# Patient Record
Sex: Male | Born: 1945 | Race: White | Hispanic: No | Smoking: Former smoker
Health system: Southern US, Community
[De-identification: ages and names within clinical notes are randomized; demographics above are authoritative.]

## PROBLEM LIST (undated history)

## (undated) DIAGNOSIS — M792 Neuralgia and neuritis, unspecified: Secondary | ICD-10-CM

## (undated) DIAGNOSIS — K219 Gastro-esophageal reflux disease without esophagitis: Secondary | ICD-10-CM

## (undated) DIAGNOSIS — G540 Brachial plexus disorders: Secondary | ICD-10-CM

## (undated) DIAGNOSIS — E782 Mixed hyperlipidemia: Secondary | ICD-10-CM

## (undated) DIAGNOSIS — M62838 Other muscle spasm: Secondary | ICD-10-CM

## (undated) DIAGNOSIS — F1910 Other psychoactive substance abuse, uncomplicated: Secondary | ICD-10-CM

## (undated) DIAGNOSIS — N200 Calculus of kidney: Secondary | ICD-10-CM

## (undated) DIAGNOSIS — M1990 Unspecified osteoarthritis, unspecified site: Secondary | ICD-10-CM

## (undated) DIAGNOSIS — M7710 Lateral epicondylitis, unspecified elbow: Secondary | ICD-10-CM

## (undated) DIAGNOSIS — M7918 Myalgia, other site: Secondary | ICD-10-CM

## (undated) DIAGNOSIS — R252 Cramp and spasm: Secondary | ICD-10-CM

## (undated) DIAGNOSIS — M5410 Radiculopathy, site unspecified: Secondary | ICD-10-CM

## (undated) DIAGNOSIS — M542 Cervicalgia: Secondary | ICD-10-CM

## (undated) DIAGNOSIS — M79646 Pain in unspecified finger(s): Secondary | ICD-10-CM

## (undated) DIAGNOSIS — I10 Essential (primary) hypertension: Secondary | ICD-10-CM

## (undated) HISTORY — DX: Radiculopathy, site unspecified: M54.10

## (undated) HISTORY — DX: Cervicalgia: M54.2

## (undated) HISTORY — DX: Other muscle spasm: M62.838

## (undated) HISTORY — DX: Cramp and spasm: R25.2

## (undated) HISTORY — DX: Lateral epicondylitis, unspecified elbow: M77.10

## (undated) HISTORY — DX: Myalgia, other site: M79.18

## (undated) HISTORY — PX: SPINE SURGERY: SHX786

## (undated) HISTORY — DX: Other psychoactive substance abuse, uncomplicated: F19.10

## (undated) HISTORY — DX: Neuralgia and neuritis, unspecified: M79.2

## (undated) HISTORY — DX: Mixed hyperlipidemia: E78.2

## (undated) HISTORY — DX: Calculus of kidney: N20.0

## (undated) HISTORY — DX: Gastro-esophageal reflux disease without esophagitis: K21.9

## (undated) HISTORY — DX: Unspecified osteoarthritis, unspecified site: M19.90

## (undated) HISTORY — DX: Pain in unspecified finger(s): M79.646

## (undated) HISTORY — DX: Brachial plexus disorders: G54.0

## (undated) HISTORY — DX: Essential (primary) hypertension: I10

---

## 2007-08-14 ENCOUNTER — Emergency Department: Payer: Self-pay | Admitting: Emergency Medicine

## 2012-02-28 DIAGNOSIS — G540 Brachial plexus disorders: Secondary | ICD-10-CM | POA: Insufficient documentation

## 2012-02-28 DIAGNOSIS — N139 Obstructive and reflux uropathy, unspecified: Secondary | ICD-10-CM | POA: Insufficient documentation

## 2012-02-28 DIAGNOSIS — Z1211 Encounter for screening for malignant neoplasm of colon: Secondary | ICD-10-CM | POA: Insufficient documentation

## 2012-02-28 DIAGNOSIS — E291 Testicular hypofunction: Secondary | ICD-10-CM | POA: Insufficient documentation

## 2012-02-28 DIAGNOSIS — I10 Essential (primary) hypertension: Secondary | ICD-10-CM

## 2012-02-28 DIAGNOSIS — G5600 Carpal tunnel syndrome, unspecified upper limb: Secondary | ICD-10-CM | POA: Insufficient documentation

## 2012-02-28 HISTORY — DX: Essential (primary) hypertension: I10

## 2012-02-28 HISTORY — DX: Brachial plexus disorders: G54.0

## 2013-06-18 DIAGNOSIS — M542 Cervicalgia: Secondary | ICD-10-CM

## 2013-06-18 DIAGNOSIS — G479 Sleep disorder, unspecified: Secondary | ICD-10-CM | POA: Insufficient documentation

## 2013-06-18 HISTORY — DX: Cervicalgia: M54.2

## 2013-07-24 DIAGNOSIS — N200 Calculus of kidney: Secondary | ICD-10-CM

## 2013-07-24 HISTORY — DX: Calculus of kidney: N20.0

## 2013-11-03 DIAGNOSIS — G43909 Migraine, unspecified, not intractable, without status migrainosus: Secondary | ICD-10-CM | POA: Insufficient documentation

## 2014-02-17 DIAGNOSIS — R252 Cramp and spasm: Secondary | ICD-10-CM

## 2014-02-17 DIAGNOSIS — M7710 Lateral epicondylitis, unspecified elbow: Secondary | ICD-10-CM

## 2014-02-17 DIAGNOSIS — M7712 Lateral epicondylitis, left elbow: Secondary | ICD-10-CM | POA: Insufficient documentation

## 2014-02-17 DIAGNOSIS — M62838 Other muscle spasm: Secondary | ICD-10-CM | POA: Insufficient documentation

## 2014-02-17 DIAGNOSIS — M5410 Radiculopathy, site unspecified: Secondary | ICD-10-CM

## 2014-02-17 DIAGNOSIS — M792 Neuralgia and neuritis, unspecified: Secondary | ICD-10-CM | POA: Insufficient documentation

## 2014-02-17 HISTORY — DX: Radiculopathy, site unspecified: M54.10

## 2014-02-17 HISTORY — DX: Cramp and spasm: R25.2

## 2014-02-17 HISTORY — DX: Lateral epicondylitis, unspecified elbow: M77.10

## 2014-03-18 DIAGNOSIS — R3916 Straining to void: Secondary | ICD-10-CM | POA: Insufficient documentation

## 2014-09-16 DIAGNOSIS — R413 Other amnesia: Secondary | ICD-10-CM | POA: Insufficient documentation

## 2014-10-16 DIAGNOSIS — M79646 Pain in unspecified finger(s): Secondary | ICD-10-CM

## 2014-10-16 HISTORY — DX: Pain in unspecified finger(s): M79.646

## 2014-11-05 DIAGNOSIS — Z86010 Personal history of colonic polyps: Secondary | ICD-10-CM | POA: Insufficient documentation

## 2015-03-22 DIAGNOSIS — E782 Mixed hyperlipidemia: Secondary | ICD-10-CM

## 2015-03-22 HISTORY — DX: Mixed hyperlipidemia: E78.2

## 2015-03-29 ENCOUNTER — Other Ambulatory Visit
Admission: RE | Admit: 2015-03-29 | Discharge: 2015-03-29 | Disposition: A | Discharge status: Home / Self Care | Payer: Medicare Other | Source: Ambulatory Visit | Attending: Pain Medicine | Admitting: Pain Medicine

## 2015-03-29 ENCOUNTER — Encounter: Payer: Self-pay | Admitting: Pain Medicine

## 2015-03-29 ENCOUNTER — Other Ambulatory Visit: Payer: Self-pay | Admitting: Respiratory Therapy

## 2015-03-29 ENCOUNTER — Other Ambulatory Visit: Payer: Self-pay

## 2015-03-29 ENCOUNTER — Ambulatory Visit (HOSPITAL_BASED_OUTPATIENT_CLINIC_OR_DEPARTMENT_OTHER): Payer: Medicare Other | Admitting: Pain Medicine

## 2015-03-29 ENCOUNTER — Ambulatory Visit
Admission: RE | Admit: 2015-03-29 | Discharge: 2015-03-29 | Disposition: A | Discharge status: Home / Self Care | Payer: Medicare Other | Source: Ambulatory Visit | Attending: Pain Medicine | Admitting: Pain Medicine

## 2015-03-29 ENCOUNTER — Other Ambulatory Visit: Payer: Self-pay | Admitting: Pain Medicine

## 2015-03-29 VITALS — BP 107/56 | HR 58 | Temp 98.4°F | Resp 18 | Ht 70.0 in | Wt 198.0 lb

## 2015-03-29 DIAGNOSIS — G5600 Carpal tunnel syndrome, unspecified upper limb: Secondary | ICD-10-CM | POA: Insufficient documentation

## 2015-03-29 DIAGNOSIS — M792 Neuralgia and neuritis, unspecified: Secondary | ICD-10-CM

## 2015-03-29 DIAGNOSIS — M7918 Myalgia, other site: Secondary | ICD-10-CM

## 2015-03-29 DIAGNOSIS — Z0000 Encounter for general adult medical examination without abnormal findings: Secondary | ICD-10-CM | POA: Insufficient documentation

## 2015-03-29 DIAGNOSIS — K219 Gastro-esophageal reflux disease without esophagitis: Secondary | ICD-10-CM | POA: Insufficient documentation

## 2015-03-29 DIAGNOSIS — F1120 Opioid dependence, uncomplicated: Secondary | ICD-10-CM

## 2015-03-29 DIAGNOSIS — Z79899 Other long term (current) drug therapy: Secondary | ICD-10-CM

## 2015-03-29 DIAGNOSIS — M79603 Pain in arm, unspecified: Secondary | ICD-10-CM

## 2015-03-29 DIAGNOSIS — M542 Cervicalgia: Secondary | ICD-10-CM | POA: Diagnosis not present

## 2015-03-29 DIAGNOSIS — F1190 Opioid use, unspecified, uncomplicated: Secondary | ICD-10-CM | POA: Insufficient documentation

## 2015-03-29 DIAGNOSIS — M961 Postlaminectomy syndrome, not elsewhere classified: Secondary | ICD-10-CM | POA: Insufficient documentation

## 2015-03-29 DIAGNOSIS — I10 Essential (primary) hypertension: Secondary | ICD-10-CM | POA: Insufficient documentation

## 2015-03-29 DIAGNOSIS — M5412 Radiculopathy, cervical region: Secondary | ICD-10-CM

## 2015-03-29 DIAGNOSIS — M79609 Pain in unspecified limb: Secondary | ICD-10-CM

## 2015-03-29 DIAGNOSIS — E785 Hyperlipidemia, unspecified: Secondary | ICD-10-CM | POA: Insufficient documentation

## 2015-03-29 DIAGNOSIS — M6249 Contracture of muscle, multiple sites: Secondary | ICD-10-CM | POA: Diagnosis not present

## 2015-03-29 DIAGNOSIS — M791 Myalgia: Secondary | ICD-10-CM | POA: Diagnosis not present

## 2015-03-29 DIAGNOSIS — G43909 Migraine, unspecified, not intractable, without status migrainosus: Secondary | ICD-10-CM | POA: Insufficient documentation

## 2015-03-29 DIAGNOSIS — Z87891 Personal history of nicotine dependence: Secondary | ICD-10-CM | POA: Diagnosis not present

## 2015-03-29 DIAGNOSIS — F1210 Cannabis abuse, uncomplicated: Secondary | ICD-10-CM

## 2015-03-29 DIAGNOSIS — M62838 Other muscle spasm: Secondary | ICD-10-CM

## 2015-03-29 DIAGNOSIS — E291 Testicular hypofunction: Secondary | ICD-10-CM

## 2015-03-29 DIAGNOSIS — M25519 Pain in unspecified shoulder: Secondary | ICD-10-CM

## 2015-03-29 DIAGNOSIS — K5903 Drug induced constipation: Secondary | ICD-10-CM

## 2015-03-29 DIAGNOSIS — R7989 Other specified abnormal findings of blood chemistry: Secondary | ICD-10-CM

## 2015-03-29 DIAGNOSIS — G8929 Other chronic pain: Secondary | ICD-10-CM | POA: Diagnosis not present

## 2015-03-29 DIAGNOSIS — F1290 Cannabis use, unspecified, uncomplicated: Secondary | ICD-10-CM | POA: Insufficient documentation

## 2015-03-29 DIAGNOSIS — F1990 Other psychoactive substance use, unspecified, uncomplicated: Secondary | ICD-10-CM

## 2015-03-29 DIAGNOSIS — T402X5A Adverse effect of other opioids, initial encounter: Secondary | ICD-10-CM

## 2015-03-29 DIAGNOSIS — Z5181 Encounter for therapeutic drug level monitoring: Secondary | ICD-10-CM | POA: Insufficient documentation

## 2015-03-29 DIAGNOSIS — Z79891 Long term (current) use of opiate analgesic: Secondary | ICD-10-CM | POA: Insufficient documentation

## 2015-03-29 DIAGNOSIS — Z7189 Other specified counseling: Secondary | ICD-10-CM

## 2015-03-29 HISTORY — DX: Other muscle spasm: M62.838

## 2015-03-29 HISTORY — DX: Myalgia, other site: M79.18

## 2015-03-29 HISTORY — DX: Neuralgia and neuritis, unspecified: M79.2

## 2015-03-29 LAB — COMPREHENSIVE METABOLIC PANEL
ALBUMIN: 3.9 g/dL (ref 3.5–5.0)
ALK PHOS: 67 U/L (ref 38–126)
ALT: 16 U/L — AB (ref 17–63)
AST: 26 U/L (ref 15–41)
Anion gap: 5 (ref 5–15)
BUN: 24 mg/dL — ABNORMAL HIGH (ref 6–20)
CALCIUM: 9 mg/dL (ref 8.9–10.3)
CO2: 29 mmol/L (ref 22–32)
CREATININE: 0.89 mg/dL (ref 0.61–1.24)
Chloride: 107 mmol/L (ref 101–111)
GFR calc non Af Amer: >60 mL/min (ref >60)
GLUCOSE: 107 mg/dL — AB (ref 65–99)
Potassium: 4.6 mmol/L (ref 3.5–5.1)
SODIUM: 141 mmol/L (ref 135–145)
Total Bilirubin: 0.1 mg/dL — ABNORMAL LOW (ref 0.3–1.2)
Total Protein: 6.6 g/dL (ref 6.5–8.1)

## 2015-03-29 LAB — SEDIMENTATION RATE: Sed Rate: 11 mm/hr (ref 0–20)

## 2015-03-29 LAB — MAGNESIUM: MAGNESIUM: 2.2 mg/dL (ref 1.7–2.4)

## 2015-03-29 LAB — C-REACTIVE PROTEIN: CRP: 0.5 mg/dL (ref <1.0)

## 2015-03-29 MED ORDER — METHADONE HCL 10 MG PO TABS: 20.0000 mg | ORAL_TABLET | Freq: Three times a day (TID) | ORAL | Status: DC

## 2015-03-29 MED ORDER — MAGNESIUM OXIDE 400 MG PO TABS: 400.0000 mg | ORAL_TABLET | Freq: Every day | ORAL | Status: DC

## 2015-03-29 MED ORDER — LUBIPROSTONE 24 MCG PO CAPS: 24.0000 ug | ORAL_CAPSULE | Freq: Two times a day (BID) | ORAL | Status: DC

## 2015-03-29 MED ORDER — PREGABALIN 150 MG PO CAPS: 150.0000 mg | ORAL_CAPSULE | Freq: Two times a day (BID) | ORAL | Status: DC

## 2015-03-29 MED ORDER — BENEFIBER PO POWD
ORAL | Status: DC
Start: 2015-03-29 — End: 2015-05-24

## 2015-03-29 NOTE — Progress Notes (Signed)
Patient's Name: Craig Alvarado MRN: [id number] DOB: July 10, 1945 DOS: 03/29/2015  Primary Reason(s) for Visit: Encounter for Medication Management CC: Arm Pain   HPI:   Craig Alvarado is a 69 y.o. year old, male patient, who returns today as an established patient. 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 His primarily concern today is the Arm Pain     The patient returns to the clinic today for pharmacological evaluation and management. He is a high risk due to methadone use, and history of illicit drug use, marijuana, and an apparent history of drug addiction. We will keep a close eye on him and he has been warned that if we find any illicit substances on his UDS, we will immediately discontinue the use of opioids. He understood and accepted. He assures me that he has not been using any Of Illegal Substances.  Today's Pain Score: 4  Reported level of pain is incompatible with clinical obrservations. This may be secondary to a possible lack of understanding on how the pain scale  works. Pain Type: Chronic pain Pain Location: Neck (arm, lower back, carpal tunnel) Pain Descriptors / Indicators: Constant, Aching Pain Frequency: Constant  Date of Last Visit: 12/14/14 Service Provided on Last Visit: Med Refill  Pharmacotherapy Review:   Side-effects or Adverse reactions: None reported. Effectiveness: Described as relatively effective, allowing for increase in activities of daily living (ADL). Onset of action: Within expected pharmacological parameters. Duration of action: Within normal limits for medication. Peak effect: Timing and results are as within normal expected parameters. [place] PMP: Compliant with practice rules and regulations. UDS Results: None available at this time. UDS Interpretation: Patient appears to be compliant with practice rules and regulations Medication Assessment Form: Reviewed. Patient indicates being compliant with therapy Treatment compliance: Compliant Substance Use Disorder (SUD) Risk Level: High, however due to the Christmas break, we will not be able to do short interval appointments at this time and I will have to give him a prescription to last until word back from the break. Pharmacologic Plan: Continue therapy as is  Allergies: Craig Alvarado has No Known Allergies.  Meds: The patient has a current medication list which includes the following prescription(s): aspirin, atorvastatin, finasteride, magnesium oxide, methadone, pregabalin, promethazine, tamsulosin, lubiprostone, methadone, and benefiber. Requested Prescriptions   Signed Prescriptions Disp Refills  . methadone (DOLOPHINE) 10 MG tablet 180 tablet 0    Sig: Take 2 tablets (20 mg total) by mouth every 8 (eight) hours.  . pregabalin (LYRICA) 150 MG capsule 60 capsule 1    Sig: Take 1 capsule (150 mg total) by mouth 2 (two) times daily.  . magnesium oxide (MAG-OX) 400 MG tablet 30 tablet 1    Sig: Take 1 tablet (400 mg total) by mouth daily.  Marland Kitchen lubiprostone (AMITIZA) 24 MCG  capsule 60 capsule 1    Sig: Take 1 capsule (24 mcg total) by mouth 2 (two) times daily with a meal. Swallow the medication whole. Do not break or chew the medication.  . Wheat Dextrin (BENEFIBER) POWD 500 g PRN    Sig: Stir 2 tsp. TID into 4-8 oz of any non-carbonated beverage or soft food (hot or cold)  . methadone (DOLOPHINE) 10 MG tablet 180 tablet 0    Sig: Take 2 tablets (20 mg total) by mouth every 8 (eight) hours.    ROS: Constitutional: Afebrile, no chills, well hydrated and well nourished Gastrointestinal: negative Musculoskeletal:negative Neurological: negative Behavioral/Psych: negative  PFSH: Medical:  Craig Alvarado  has a past medical history of Arthritis; GERD (gastroesophageal reflux disease); Substance abuse; BPN (brachial plexus neuropathy) (02/28/2012); Essential (primary) hypertension (02/28/2012); and Combined fat and carbohydrate induced hyperlipemia (03/22/2015). Family: family history includes Alcohol abuse in his father; Alzheimer's disease in his mother; Cancer in his father. Surgical:  has past surgical history that includes Spine surgery. Tobacco:  reports that he quit smoking about 3 months ago. He does not have any smokeless tobacco history on file. Alcohol:  reports that he does not drink alcohol. Drug:  reports that he does not use illicit drugs.  Physical Exam: Vitals:  Today's Vitals   03/29/15 0846 03/29/15 0859 03/29/15 0902  BP: 107/56    Pulse: 58    Temp: 98.4 F (36.9 C)    TempSrc: Oral    Resp: 18    Height: 5\' 10"  (1.778 m)    Weight: 198 lb (89.812 kg)    SpO2: 98%    PainSc: 4  4  4    Calculated BMI: Body mass index is 28.41 kg/(m^2). General appearance: alert, appears older than stated age and no distress Eyes: PERLA Respiratory: No evidence respiratory distress, no audible rales or ronchi and no use of accessory muscles of respiration Neck: no adenopathy, no carotid bruit, no JVD, supple, symmetrical, trachea midline and thyroid not  enlarged, symmetric, no tenderness/mass/nodules  Cervical Spine ROM: Adequate for flexion, extension, rotation, and lateral bending Palpation: No palpable trigger points  Upper Extremities ROM: Adequate bilaterally Strength: 5/5 for all flexors and extensors of the upper extremity, bilaterally Pulses: Palpable bilaterally Neurologic: No allodynia, No hyperesthesia, No hyperpathia and No sensory abnormalities  Lumbar Spine ROM: Adequate for flexion, extension, rotation, and lateral bending Palpation: No palpable trigger points Lumbar Hyperextension and rotation: Non-contributory Patrick's Maneuver: Non-contributory  Lower Extremities ROM: Adequate bilaterally Strength: 5/5 for all flexors and extensors of the lower extremity, bilaterally Pulses: Palpable bilaterally Neurologic: No allodynia, No hyperesthesia, No hyperpathia, No sensory abnormalities and No antalgic gait or posture  Assessment: Encounter Diagnosis:  Primary Diagnosis: Chronic pain [G89.29]  Plan: Interventional: None planned at this time.  Soren was seen today for arm pain.  Diagnoses and all orders for this visit:  Chronic pain -     COMPLETE METABOLIC PANEL WITH GFR; Future -     C-reactive protein; Future -     Magnesium; Future -     Sedimentation rate; Future -     Vitamin D2,D3 Panel; Future -     EKG 12-Lead; Standing -     methadone (DOLOPHINE) 10 MG tablet; Take 2 tablets (20 mg total) by mouth every 8 (eight) hours. -     methadone (DOLOPHINE) 10 MG tablet; Take 2 tablets (20 mg total) by mouth every 8 (eight) hours.  Neuropathic pain -     pregabalin (LYRICA)  150 MG capsule; Take 1 capsule (150 mg total) by mouth 2 (two) times daily.  Neurogenic pain -     pregabalin (LYRICA) 150 MG capsule; Take 1 capsule (150 mg total) by mouth 2 (two) times daily.  Muscle spasticity  Musculoskeletal pain  Myofascial pain  Long term current use of opiate analgesic -     Drugs of abuse screen w/o  alc, rtn urine-sln  Long term prescription opiate use  Opiate use  Methadone use ([facility]) -     EKG 12-Lead; Standing  Encounter for therapeutic drug level monitoring  Encounter for chronic pain management  Uncomplicated opioid dependence ([place])  Therapeutic opioid-induced constipation (OIC) -     lubiprostone (AMITIZA) 24 MCG capsule; Take 1 capsule (24 mcg total) by mouth 2 (two) times daily with a meal. Swallow the medication whole. Do not break or chew the medication. -     Wheat Dextrin (BENEFIBER) POWD; Stir 2 tsp. TID into 4-8 oz of any non-carbonated beverage or soft food (hot or cold)  Constipation due to opioid therapy -     lubiprostone (AMITIZA) 24 MCG capsule; Take 1 capsule (24 mcg total) by mouth 2 (two) times daily with a meal. Swallow the medication whole. Do not break or chew the medication. -     Wheat Dextrin (BENEFIBER) POWD; Stir 2 tsp. TID into 4-8 oz of any non-carbonated beverage or soft food (hot or cold)  Chronic cervical radicular pain  Failed cervical surgery syndrome  Illicit drug use  Marijuana use  Chronic neck pain  Chronic upper extremity pain, unspecified laterality  Chronic shoulder pain, unspecified laterality  Low testosterone  Other orders -     magnesium oxide (MAG-OX) 400 MG tablet; Take 1 tablet (400 mg total) by mouth daily.     There are no Patient Instructions on file for this visit. Medications discontinued today:  Medications Discontinued During This Encounter  Medication Reason  . SUMAtriptan (IMITREX) 50 MG tablet Error  . methadone (DOLOPHINE) 10 MG tablet Reorder  . pregabalin (LYRICA) 150 MG capsule Reorder  . magnesium oxide (MAG-OX) 400 MG tablet Reorder   Medications administered today:  Craig Alvarado had no medications administered during this visit.  Primary Care Physician: Fritzi Mandes, MD Location: [facility] Facility Note by: Kathlen Brunswick Dossie Arbour, M.D, DABA, DABAPM, DABPM,  DABIPP, FIPP

## 2015-03-29 NOTE — Progress Notes (Signed)
Safety precautions to be maintained throughout the outpatient stay will include: orient to surroundings, keep bed in low position, maintain call bell within reach at all times, provide assistance with transfer out of bed and ambulation. Methadone pill count #27

## 2015-03-29 NOTE — Progress Notes (Deleted)
   Subjective:    Patient ID: Craig Alvarado, male    DOB: May 30, 1945, 69 y.o.   MRN: [id number]  HPI    Review of Systems     Objective:   Physical Exam        Assessment & Plan:

## 2015-03-31 ENCOUNTER — Telehealth: Payer: Self-pay

## 2015-03-31 ENCOUNTER — Other Ambulatory Visit: Payer: Self-pay | Admitting: Pain Medicine

## 2015-03-31 DIAGNOSIS — G8929 Other chronic pain: Secondary | ICD-10-CM

## 2015-03-31 MED ORDER — METHADONE HCL 10 MG PO TABS: 20.0000 mg | ORAL_TABLET | Freq: Three times a day (TID) | ORAL | Status: DC

## 2015-03-31 NOTE — Telephone Encounter (Signed)
Pharmacy will accept the one to be fillid 03-29-15. Pt notified by voicemail that he must return the one to be fillied 04-28-15.

## 2015-03-31 NOTE — Telephone Encounter (Signed)
Pt called to say the yr is wrong on his script and wants to know what does he need to do?

## 2015-04-01 LAB — 25-HYDROXYVITAMIN D LCMS D2+D3: 25-HYDROXY, VITAMIN D: 24 ng/mL — AB

## 2015-04-01 LAB — 25-HYDROXY VITAMIN D LCMS D2+D3: 25-Hydroxy, Vitamin D-3: 24 ng/mL

## 2015-04-01 NOTE — Progress Notes (Signed)

## 2015-04-03 LAB — TOXASSURE SELECT 13 (MW), URINE: PDF: 0

## 2015-05-19 DIAGNOSIS — R519 Headache, unspecified: Secondary | ICD-10-CM | POA: Insufficient documentation

## 2015-05-19 DIAGNOSIS — R51 Headache: Secondary | ICD-10-CM

## 2015-05-19 DIAGNOSIS — R399 Unspecified symptoms and signs involving the genitourinary system: Secondary | ICD-10-CM | POA: Insufficient documentation

## 2015-05-24 ENCOUNTER — Other Ambulatory Visit: Payer: Self-pay | Admitting: Pain Medicine

## 2015-05-24 ENCOUNTER — Encounter: Payer: Self-pay | Admitting: Pain Medicine

## 2015-05-24 ENCOUNTER — Ambulatory Visit: Payer: Medicare Other | Attending: Pain Medicine | Admitting: Pain Medicine

## 2015-05-24 VITALS — BP 131/72 | HR 50 | Temp 97.9°F | Resp 18 | Ht 70.0 in | Wt 194.0 lb

## 2015-05-24 DIAGNOSIS — F1190 Opioid use, unspecified, uncomplicated: Secondary | ICD-10-CM | POA: Diagnosis not present

## 2015-05-24 DIAGNOSIS — F1120 Opioid dependence, uncomplicated: Secondary | ICD-10-CM

## 2015-05-24 DIAGNOSIS — K219 Gastro-esophageal reflux disease without esophagitis: Secondary | ICD-10-CM | POA: Diagnosis not present

## 2015-05-24 DIAGNOSIS — M792 Neuralgia and neuritis, unspecified: Secondary | ICD-10-CM

## 2015-05-24 DIAGNOSIS — G5603 Carpal tunnel syndrome, bilateral upper limbs: Secondary | ICD-10-CM | POA: Diagnosis not present

## 2015-05-24 DIAGNOSIS — Z79891 Long term (current) use of opiate analgesic: Secondary | ICD-10-CM | POA: Diagnosis not present

## 2015-05-24 DIAGNOSIS — G8929 Other chronic pain: Secondary | ICD-10-CM

## 2015-05-24 DIAGNOSIS — E782 Mixed hyperlipidemia: Secondary | ICD-10-CM | POA: Diagnosis not present

## 2015-05-24 DIAGNOSIS — Z87891 Personal history of nicotine dependence: Secondary | ICD-10-CM | POA: Insufficient documentation

## 2015-05-24 DIAGNOSIS — M961 Postlaminectomy syndrome, not elsewhere classified: Secondary | ICD-10-CM | POA: Diagnosis not present

## 2015-05-24 DIAGNOSIS — K5903 Drug induced constipation: Secondary | ICD-10-CM | POA: Diagnosis not present

## 2015-05-24 DIAGNOSIS — E559 Vitamin D deficiency, unspecified: Secondary | ICD-10-CM

## 2015-05-24 DIAGNOSIS — I10 Essential (primary) hypertension: Secondary | ICD-10-CM | POA: Diagnosis not present

## 2015-05-24 DIAGNOSIS — M542 Cervicalgia: Secondary | ICD-10-CM | POA: Diagnosis present

## 2015-05-24 DIAGNOSIS — Z9889 Other specified postprocedural states: Secondary | ICD-10-CM | POA: Insufficient documentation

## 2015-05-24 DIAGNOSIS — F1910 Other psychoactive substance abuse, uncomplicated: Secondary | ICD-10-CM | POA: Insufficient documentation

## 2015-05-24 DIAGNOSIS — Z5181 Encounter for therapeutic drug level monitoring: Secondary | ICD-10-CM

## 2015-05-24 DIAGNOSIS — T402X5A Adverse effect of other opioids, initial encounter: Secondary | ICD-10-CM

## 2015-05-24 MED ORDER — LUBIPROSTONE 24 MCG PO CAPS: 24.0000 ug | ORAL_CAPSULE | Freq: Two times a day (BID) | ORAL | Status: DC

## 2015-05-24 MED ORDER — VITAMIN D (ERGOCALCIFEROL) 1.25 MG (50000 UNIT) PO CAPS: 50000.0000 [IU] | ORAL_CAPSULE | ORAL | Status: AC

## 2015-05-24 MED ORDER — MAGNESIUM OXIDE 400 MG PO TABS: 400.0000 mg | ORAL_TABLET | Freq: Every day | ORAL | Status: AC

## 2015-05-24 MED ORDER — BENEFIBER PO POWD: ORAL | Status: DC

## 2015-05-24 MED ORDER — VITAMIN D3 50 MCG (2000 UT) PO CAPS: 2000.0000 [IU] | ORAL_CAPSULE | Freq: Every day | ORAL | Status: AC

## 2015-05-24 MED ORDER — PREGABALIN 150 MG PO CAPS: 150.0000 mg | ORAL_CAPSULE | Freq: Two times a day (BID) | ORAL | Status: DC

## 2015-05-24 MED ORDER — METHADONE HCL 10 MG PO TABS: 20.0000 mg | ORAL_TABLET | Freq: Three times a day (TID) | ORAL | Status: DC

## 2015-05-24 NOTE — Progress Notes (Signed)
Patient's Name: Craig Alvarado MRN: [id number] DOB: Mar 13, 1946 DOS: 05/24/2015  Primary Reason(s) for Visit: Encounter for Medication Management CC: Neck Pain   HPI  Craig Alvarado is a 70 y.o. year old, male patient, who returns today as an established patient. 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 His primarily concern today is the Neck Pain   The patient returns to the clinic today for pharmacological management of his chronic pain. Once again, today he has asked me about the urine drug screen test and whether or not they are necessary. I have informed the patient to get used to them since he does have a history of illegal drug use and therefore he is considered to be high risk and we will continue to monitor him very closely. Pill count for this patient today: Lyrica 150mg  8/60 filled 03/29/15 ;Methadone 10mg  23/180 filled 04/28/15.   Reported Pain Score: 4 , clinically he looks like a 1/10. Reported level is  inconsistent with clinical obrservations. Pain Type: Chronic pain Pain Location: Neck Pain Orientation: Right Pain Descriptors / Indicators: Constant, Aching Pain Frequency: Constant  Date of Last Visit: 03/29/15 Service Provided on Last Visit: Med Refill  Pharmacotherapy  Medication(s): The patient uses methadone 10 mg 2 tablets by mouth every 8 hours for the pain. (MDE 600 mg per day) Onset of action: Within expected pharmacological parameters Time to Peak effect: Timing and results are as within normal expected parameters Analgesic Effect: More than 50% Activity Facilitation: Medication(s) allow patient to sit, stand, walk, and do the basic ADLs Perceived Effectiveness: Described as relatively effective, allowing for increase in activities of daily living (ADL) Side-effects or Adverse reactions: None reported Duration of action: Within normal limits for medication [place] PMP: Compliant with practice rules and regulations UDS Results: The patient's last UDS done on 03/29/2015 came back within normal limits and no unexpected results. UDS Interpretation: Patient appears to be compliant with practice rules and regulations Medication Assessment Form: Reviewed. Patient indicates being compliant with therapy Treatment compliance: Compliant Substance Use Disorder (SUD) Risk Level: High, secondary to a polysubstance abuse history. Pharmacologic Plan: Continue therapy as is  Lab Work: Illicit Drugs No results found for: THCU, COCAINSCRNUR, PCPSCRNUR, MDMA, AMPHETMU, METHADONE, ETOH  Inflammation Markers Lab Results  Component Value Date   ESRSEDRATE 11 03/29/2015   CRP 0.5 03/29/2015    Renal Function Lab Results  Component Value Date   BUN 24* 03/29/2015   CREATININE 0.89 03/29/2015   GFRAA >60 03/29/2015   GFRNONAA >60 03/29/2015    Hepatic Function Lab Results  Component Value Date   AST 26 03/29/2015   ALT 16* 03/29/2015   ALBUMIN 3.9 03/29/2015    Electrolytes Lab  Results  Component Value Date   NA  141 03/29/2015   K 4.6 03/29/2015   CL 107 03/29/2015   CALCIUM 9.0 03/29/2015   MG 2.2 03/29/2015    Allergies  Craig Alvarado has No Known Allergies.  Meds  The patient has a current medication list which includes the following prescription(s): aspirin, atorvastatin, finasteride, lubiprostone, magnesium oxide, methadone, methadone, pregabalin, promethazine, tamsulosin, benefiber, vitamin d3, and vitamin d (ergocalciferol).  Current Outpatient Prescriptions on File Prior to Visit  Medication Sig  . aspirin 81 MG tablet Take 81 mg by mouth daily.  Marland Kitchen atorvastatin (LIPITOR) 20 MG tablet Take 20 mg by mouth daily.  . finasteride (PROSCAR) 5 MG tablet Take 5 mg by mouth daily.  . promethazine (PHENERGAN) 25 MG tablet Take 25 mg by mouth every 6 (six) hours as needed for nausea or vomiting.  . tamsulosin (FLOMAX) 0.4 MG CAPS capsule Take 0.4 mg by mouth daily.   No current facility-administered medications on file prior to visit.    ROS  Constitutional: Afebrile, no chills, well hydrated and well nourished Gastrointestinal: negative Musculoskeletal:negative Neurological: negative Behavioral/Psych: negative  PFSH  Medical:  Craig Alvarado  has a past medical history of Arthritis; GERD (gastroesophageal reflux disease); Substance abuse; BPN (brachial plexus neuropathy) (02/28/2012); Essential (primary) hypertension (02/28/2012); and Combined fat and carbohydrate induced hyperlipemia (03/22/2015). Family: family history includes Alcohol abuse in his father; Alzheimer's disease in his mother; Cancer in his father. Surgical:  has past surgical history that includes Spine surgery. Tobacco:  reports that he quit smoking about 5 months ago. He does not have any smokeless tobacco history on file. Alcohol:  reports that he drinks alcohol. Drug:  reports that he does not use illicit drugs.  Physical Exam  Vitals:  Today's Vitals   05/24/15 0846 05/24/15 0848   BP: 131/72   Pulse: 50   Temp: 97.9 F (36.6 C)   TempSrc: Oral   Resp: 18   Height: 5\' 10"  (1.778 m)   Weight: 194 lb (87.998 kg)   SpO2: 99%   PainSc:  4     Calculated BMI: Body mass index is 27.84 kg/(m^2).  General appearance: alert, cooperative, appears stated age and no distress Eyes: PERLA Respiratory: No evidence respiratory distress, no audible rales or ronchi and no use of accessory muscles of respiration  Cervical Spine Inspection: Normal anatomy Alignment: Symetrical ROM: Adequate Palpation: WNL  Upper Extremities Inspection: No gross anomalies detected ROM: Adequate Sensory: Pain in the area of the wrists and hands covering the distribution of the median nerve, bilaterally Motor: Unremarkable  Thoracic Spine Inspection: No gross anomalies detected Alignment: Symetrical ROM: Adequate Palpation: WNL  Lumbar Spine Inspection: No gross anomalies detected Alignment: Symetrical ROM: Adequate Gait: WNL  Lower Extremities Inspection: No gross anomalies detected ROM: Adequate Sensory:  Normal Motor: Unremarkable  Toe walk (S1): WNL  Heal walk (L5): WNL  Assessment & Plan  Primary Diagnosis & Pertinent Problem List: The primary encounter diagnosis was Chronic pain. 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  Visit Diagnosis: 1. Chronic pain   2. Failed cervical surgery syndrome   3. Bilateral carpal tunnel syndrome   4. Long term current use of opiate analgesic   5. Encounter for therapeutic drug level monitoring   6. 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 Constipation due to opioid therapy   8. Therapeutic opioid-induced constipation  (OIC)   9. Neuropathic pain   10. Neurogenic pain   11. Vitamin D insufficiency     Assessment: No problem-specific assessment & plan notes found for this encounter.   Plan of Care  Pharmacotherapy (Medications Ordered): Meds ordered this encounter  Medications  . methadone (DOLOPHINE) 10 MG tablet    Sig: Take 2 tablets (20 mg total) by mouth every 8 (eight) hours.    Dispense:  180 tablet    Refill:  0    Do not place this medication, or any other prescription from our practice, on "Automatic Refill". Patient may have prescription filled one day early if pharmacy is closed on scheduled refill date. Do not fill until: 05/28/15 To last until: 06/25/15  . methadone (DOLOPHINE) 10 MG tablet    Sig: Take 2 tablets (20 mg total) by mouth every 8 (eight) hours.    Dispense:  180 tablet    Refill:  0    Do not place this medication, or any other prescription from our practice, on "Automatic Refill". Patient may have prescription filled one day early if pharmacy is closed on scheduled refill date. Do not fill until: 06/25/15 To last until: 07/25/15  . lubiprostone (AMITIZA) 24 MCG capsule    Sig: Take 1 capsule (24 mcg total) by mouth 2 (two) times daily with a meal. Swallow the medication whole. Do not break or chew the medication.    Dispense:  60 capsule    Refill:  1    Do not place this medication, or any other prescription from our practice, on "Automatic Refill". Patient may have prescription filled one day early if pharmacy is closed on scheduled refill date.  . Wheat Dextrin (BENEFIBER) POWD    Sig: Stir 2 tsp. TID into 4-8 oz of any non-carbonated beverage or soft food (hot or cold)    Dispense:  500 g    Refill:  PRN    Do not place this medication, or any other prescription from our practice, on "Automatic Refill". Patient may have prescription filled one day early if pharmacy is closed on scheduled refill date.  . pregabalin (LYRICA) 150 MG capsule    Sig: Take 1 capsule  (150 mg total) by mouth 2 (two) times daily.    Dispense:  60 capsule    Refill:  1    Do not place this medication, or any other prescription from our practice, on "Automatic Refill". Patient may have prescription filled one day early if pharmacy is closed on scheduled refill date.  . magnesium oxide (MAG-OX) 400 MG tablet    Sig: Take 1 tablet (400 mg total) by mouth daily.    Dispense:  30 tablet    Refill:  1    Do not place this medication, or any other prescription from our practice, on "Automatic Refill". Patient may have prescription filled one day early if pharmacy is closed on scheduled refill date.  . Cholecalciferol (VITAMIN D3) 2000 units capsule    Sig: Take 1 capsule (2,000 Units total) by mouth daily.    Dispense:  30 capsule    Refill:  PRN    Do not place this medication, or any other prescription from our practice, on "Automatic Refill".  . Vitamin D, Ergocalciferol, (DRISDOL) 50000 units CAPS capsule    Sig: Take 1 capsule (50,000 Units total) by mouth 2 (two) times a week. X 6 weeks.  Dispense:  12 capsule    Refill:  0    Do not place this medication, or any other prescription from our practice, on "Automatic Refill".    Lab-work & Procedure Ordered: No orders of the defined types were placed in this encounter.    Imaging Ordered: None  Interventional Therapies: Scheduled: None at this point PRN Procedures: None at this point    Referral(s) or Consult(s): None at this point  Medications administered during this visit: Mr. Patao had no medications administered during this visit.  Future Appointments Date Time Provider [place]  07/19/2015 10:20 AM Milinda Pointer, MD [facility] None    Primary Care Physician: Fritzi Mandes, MD Location: [facility] Outpatient [facility] Facility Note by: Kathlen Brunswick Dossie Arbour, M.D, DABA, DABAPM, DABPM, DABIPP, FIPP

## 2015-05-24 NOTE — Progress Notes (Signed)
Safety precautions to be maintained throughout the outpatient stay will include: orient to surroundings, keep bed in low position, maintain call bell within reach at all times, provide assistance with transfer out of bed and ambulation.  Pt here for med refill meds remaining Lyrica 150mg  8/60 filled 03/29/15                             Methadone 10mg  23/180 filled 04/28/15

## 2015-05-29 LAB — TOXASSURE SELECT 13 (MW), URINE: PDF: 0

## 2015-06-25 DIAGNOSIS — E782 Mixed hyperlipidemia: Secondary | ICD-10-CM | POA: Insufficient documentation

## 2015-06-28 DIAGNOSIS — Z9181 History of falling: Secondary | ICD-10-CM | POA: Insufficient documentation

## 2015-07-19 ENCOUNTER — Encounter: Payer: Self-pay | Admitting: Pain Medicine

## 2015-07-19 ENCOUNTER — Ambulatory Visit: Payer: Medicare Other | Attending: Pain Medicine | Admitting: Pain Medicine

## 2015-07-19 ENCOUNTER — Telehealth: Payer: Self-pay

## 2015-07-19 VITALS — BP 113/60 | HR 56 | Temp 97.6°F | Resp 16 | Ht 70.0 in | Wt 194.0 lb

## 2015-07-19 DIAGNOSIS — E559 Vitamin D deficiency, unspecified: Secondary | ICD-10-CM | POA: Insufficient documentation

## 2015-07-19 DIAGNOSIS — F1290 Cannabis use, unspecified, uncomplicated: Secondary | ICD-10-CM | POA: Insufficient documentation

## 2015-07-19 DIAGNOSIS — Z86010 Personal history of colonic polyps: Secondary | ICD-10-CM | POA: Insufficient documentation

## 2015-07-19 DIAGNOSIS — K5903 Drug induced constipation: Secondary | ICD-10-CM | POA: Diagnosis not present

## 2015-07-19 DIAGNOSIS — Z5181 Encounter for therapeutic drug level monitoring: Secondary | ICD-10-CM

## 2015-07-19 DIAGNOSIS — I10 Essential (primary) hypertension: Secondary | ICD-10-CM | POA: Insufficient documentation

## 2015-07-19 DIAGNOSIS — G5600 Carpal tunnel syndrome, unspecified upper limb: Secondary | ICD-10-CM

## 2015-07-19 DIAGNOSIS — Z87891 Personal history of nicotine dependence: Secondary | ICD-10-CM | POA: Diagnosis not present

## 2015-07-19 DIAGNOSIS — R51 Headache: Secondary | ICD-10-CM | POA: Diagnosis not present

## 2015-07-19 DIAGNOSIS — Z79891 Long term (current) use of opiate analgesic: Secondary | ICD-10-CM | POA: Diagnosis not present

## 2015-07-19 DIAGNOSIS — Z9889 Other specified postprocedural states: Secondary | ICD-10-CM | POA: Insufficient documentation

## 2015-07-19 DIAGNOSIS — M4802 Spinal stenosis, cervical region: Secondary | ICD-10-CM

## 2015-07-19 DIAGNOSIS — F1190 Opioid use, unspecified, uncomplicated: Secondary | ICD-10-CM

## 2015-07-19 DIAGNOSIS — M542 Cervicalgia: Secondary | ICD-10-CM | POA: Diagnosis not present

## 2015-07-19 DIAGNOSIS — M961 Postlaminectomy syndrome, not elsewhere classified: Secondary | ICD-10-CM

## 2015-07-19 DIAGNOSIS — E7801 Familial hypercholesterolemia: Secondary | ICD-10-CM | POA: Diagnosis not present

## 2015-07-19 DIAGNOSIS — M25519 Pain in unspecified shoulder: Secondary | ICD-10-CM | POA: Insufficient documentation

## 2015-07-19 DIAGNOSIS — T402X5A Adverse effect of other opioids, initial encounter: Secondary | ICD-10-CM

## 2015-07-19 DIAGNOSIS — R413 Other amnesia: Secondary | ICD-10-CM | POA: Insufficient documentation

## 2015-07-19 DIAGNOSIS — G8929 Other chronic pain: Secondary | ICD-10-CM

## 2015-07-19 DIAGNOSIS — M792 Neuralgia and neuritis, unspecified: Secondary | ICD-10-CM

## 2015-07-19 DIAGNOSIS — R937 Abnormal findings on diagnostic imaging of other parts of musculoskeletal system: Secondary | ICD-10-CM

## 2015-07-19 DIAGNOSIS — K219 Gastro-esophageal reflux disease without esophagitis: Secondary | ICD-10-CM | POA: Insufficient documentation

## 2015-07-19 DIAGNOSIS — M5412 Radiculopathy, cervical region: Secondary | ICD-10-CM

## 2015-07-19 DIAGNOSIS — G479 Sleep disorder, unspecified: Secondary | ICD-10-CM | POA: Insufficient documentation

## 2015-07-19 MED ORDER — NALOXONE HCL 4 MG/0.1ML NA LIQD: 1.0000 | Freq: Once | NASAL | Status: AC

## 2015-07-19 MED ORDER — METHADONE HCL 10 MG PO TABS: 20.0000 mg | ORAL_TABLET | Freq: Three times a day (TID) | ORAL | Status: DC

## 2015-07-19 MED ORDER — BENEFIBER PO POWD: ORAL | Status: DC

## 2015-07-19 MED ORDER — PREGABALIN 150 MG PO CAPS: 150.0000 mg | ORAL_CAPSULE | Freq: Two times a day (BID) | ORAL | Status: DC

## 2015-07-19 MED ORDER — LUBIPROSTONE 24 MCG PO CAPS: 24.0000 ug | ORAL_CAPSULE | Freq: Two times a day (BID) | ORAL | Status: DC

## 2015-07-19 NOTE — Progress Notes (Signed)
Safety precautions to be maintained throughout the outpatient stay will include: orient to surroundings, keep bed in low position, maintain call bell within reach at all times, provide assistance with transfer out of bed and ambulation.  Medication count: methadone 10 mg tablets 30 /180 filled on 06/25/2015; lyrica capsules 150mg  3/60 filled on 06/25/2015

## 2015-07-19 NOTE — Telephone Encounter (Signed)
Pt needs Dr. Dossie Arbour to write script for Amitiza

## 2015-07-19 NOTE — Progress Notes (Signed)
Patient's Name: Craig Alvarado MRN: [id number] DOB: [birth year] DOS: 07/19/2015  Primary Reason(s) for Visit: Encounter for Medication Management CC: Arm Pain; Shoulder Pain; and Hand Pain   HPI  Craig Alvarado is a 70 y.o. year old, male patient, who returns today as an established patient. 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[place]); Encounter for therapeutic drug level monitoring; Encounter for chronic pain management; Opioid dependence ([place]); Opioid-induced constipation (OIC); Chronic cervical radicular pain (Right); 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 His primarily concern today is the Arm Pain; Shoulder Pain; and Hand Pain   The patient returns to the clinics today for pharmacological management of his chronic pain. Once again he has requested that I extend his visits to 3 months. I have explained to the patient that this is not going to happen since he is high risk for substance use disorder. He has a history of marijuana use and illicit drugs with positive urine drug screening tests for cannabinoids. The patient was given a warning on this and since he has come back with normal tests. However, he is reluctant to consider interventional  therapies and he is currently on methadone using 20 mg every 8 hours (60 mg/day) this represents a 600 MME/Day, which is considerable. Today we talked about this and in compliance with CDC guidelines I have sent to the pharmacy a prescription for Narcan to be used in case of emergencies. I did explain the patient how to use it and I have asked the pharmacist to do the same. The patient was also provided with written information with regards to this life saving measure and I took the time today to talk to him about all the side effects and possible complications associated to the use of opioids. He is already having problems with opioid-induced constipation as well as low testosterone. Today we spoke about his dose and how in the future we may have to titrate it down and stop it for the purpose of doing a "Drug Holiday" for the purpose of addressing his opioid tolerance.  In addition, we have talked to this patient about interventional therapy such as cervical epidural steroid injections in order to curve down his pain. He indicates not being interested in this.  Pain Assessment: Self-Reported Pain Score: 4 , clinically he looks like a 0-1/10. Reported level is compatible with observation Pain Type: Chronic pain Pain Location: Arm Pain Orientation: Right Pain Descriptors / Indicators: Aching, Constant ("the more I use the arm, the worse the pain gets") Pain Frequency: Constant  Date of Last Visit: 05/24/15 Service Provided on Last Visit: Med Refill  Controlled Substance Pharmacotherapy Assessment  Analgesic: Methadone 10 mg 2 tablets every 8 hours (60 mg/day) Pill Count: methadone 10 mg tablets 30 /180 filled on 06/25/2015; lyrica capsules 150mg  3/60 filled on 06/25/2015. MME/day: 600 mg/day Pharmacokinetics: Onset of action (Liberation/Absorption): Within expected pharmacological parameters Time to Peak effect (  Distribution): Timing and results are as within normal expected parameters Duration  of action (Metabolism/Excretion): Within normal limits for medication Pharmacodynamics: Analgesic Effect: More than 50% Activity Facilitation: Medication(s) allow patient to sit, stand, walk, and do the basic ADLs Perceived Effectiveness: Described as relatively effective, allowing for increase in activities of daily living (ADL) Side-effects or Adverse reactions: Moderate. Low testosterone and opioid-induced constipation. Monitoring: [place] PMP: Online review of the past 29-month period conducted. Compliant with practice rules and regulations UDS Results/interpretation: The patient's last UDS was done on 05/24/2015 and it came back within normal limits with no unexpected results. Prior to that his last one was done on 03/29/2015 and it also came back within normal limits. The patient does have a history of positive cannabinoids on urine drug screening test done while he was a patient of [facility]. Copies of these tests can be found under Epic media. They UDS that were positive were done on 06/10/2014 and 07/06/2014. Medication Assessment Form: Reviewed. Patient indicates being compliant with therapy Treatment compliance: Compliant Risk Assessment: Aberrant Behavior: use of illicit substances, inability to consider abstinence, claims that "nothing else works", resistance to changing therapy and extensive time discussing medication  Substance Use Disorder (SUD) Risk Level: High-to-Moderate Opioid Risk Tool (ORT) Score:  6 Moderate Risk for SUD (Score between 4-7) Depression Scale Score: PHQ-2: PHQ-2 Total Score: 0 No depression (0) PHQ-9: PHQ-9 Total Score: 0 No depression (0-4)  Pharmacologic Plan: No change in therapy, at this time   Laboratory Workup  Last ED UDS: No results found for: THCU, COCAINSCRNUR, PCPSCRNUR, MDMA, AMPHETMU, METHADONE, ETOH  Inflammation Markers Lab Results  Component Value Date   ESRSEDRATE 11 03/29/2015   CRP 0.5 03/29/2015    Renal Function Lab Results  Component  Value Date   BUN 24* 03/29/2015   CREATININE 0.89 03/29/2015   GFRAA >60 03/29/2015   GFRNONAA >60 03/29/2015    Hepatic Function Lab Results  Component Value Date   AST 26 03/29/2015   ALT 16* 03/29/2015   ALBUMIN 3.9 03/29/2015    Electrolytes Lab Results  Component Value Date   NA 141 03/29/2015   K 4.6 03/29/2015   CL 107 03/29/2015   CALCIUM 9.0 03/29/2015   MG 2.2 03/29/2015    Allergies  Craig Alvarado has No Known Allergies.  Meds  The patient has a current medication list which includes the following prescription(s): aspirin, atorvastatin, vitamin d3, docusate sodium, finasteride, lubiprostone, magnesium oxide, methadone, pregabalin, promethazine, sumatriptan, tamsulosin, benefiber, methadone, and naloxone hcl.  Current Outpatient Prescriptions on File Prior to Visit  Medication Sig  . aspirin 81 MG tablet Take 81 mg by mouth daily.  Marland Kitchen atorvastatin (LIPITOR) 20 MG tablet Take 20 mg by mouth daily.  . Cholecalciferol (VITAMIN D3) 2000 units capsule Take 1 capsule (2,000 Units total) by mouth daily.  . finasteride (PROSCAR) 5 MG tablet Take 5 mg by mouth daily.  . magnesium oxide (MAG-OX) 400 MG tablet Take 1 tablet (400 mg total) by mouth daily.  . promethazine (PHENERGAN) 25 MG tablet Take 25 mg by mouth every 6 (six) hours as needed for nausea or vomiting.  . tamsulosin (FLOMAX) 0.4 MG CAPS capsule Take 0.4 mg by mouth daily.   No current facility-administered medications on file prior to visit.    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 Family: family history includes Alcohol abuse in his father; Alzheimer's disease in his mother; Cancer in his father. Surgical:  has past surgical history that includes Spine surgery. Tobacco:  reports that he quit smoking about 7 months ago. He does not have any smokeless tobacco history on file. Alcohol:  reports that he drinks alcohol. Drug:  reports that he does not use illicit drugs.  Physical Exam  Vitals:  Today's Vitals   07/19/15 1105 07/19/15 1106  BP: 113/60   Pulse: 56   Temp: 97.6 F (36.4 C)   TempSrc: Oral   Resp: 16   Height: 5\' 10"  (1.778 m)   Weight: 194 lb (87.998 kg)   SpO2: 97%   PainSc: 4  4   PainLoc: Arm     Calculated BMI: Body mass index is 27.84 kg/(m^2).     General appearance: alert, cooperative, appears stated age and no distress Eyes: PERLA Respiratory: No evidence respiratory distress, no audible rales or ronchi and no use of accessory muscles of respiration  Cervical Spine Inspection: Normal anatomy Alignment: Symetrical ROM: Decreased  Upper Extremities Inspection: No gross anomalies detected ROM: Adequate Sensory: Normal Motor: Grossly intact  Assessment & Plan  Primary Diagnosis & Pertinent Problem List: The primary encounter diagnosis was Chronic pain. 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  Visit Diagnosis: 1. Chronic pain   2. Encounter for therapeutic drug level monitoring   3. Long term current use of opiate analgesic   4. Therapeutic opioid-induced constipation (OIC)   5. Constipation due to opioid therapy   6. Neuropathic pain   7. Neurogenic pain   8. Opiate use (600 MME/Day)   9. Abnormal CT scan, cervical spine   10. Failed cervical surgery syndrome   11. 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 Carpal tunnel syndrome, unspecified laterality   13. Chronic cervical radicular pain (Right)     Problem-specific Plan(s): No problem-specific assessment & plan notes found for this encounter.   Plan of Care  Pharmacotherapy (Medications Ordered): Meds ordered this encounter  Medications  . methadone (DOLOPHINE) 10 MG tablet    Sig: Take 2 tablets (20 mg total) by mouth every 8 (eight) hours.    Dispense:  180 tablet    Refill:  0    Do not place this medication, or any other prescription from our practice, on "Automatic Refill". Patient may have prescription filled one day early if pharmacy is closed on scheduled refill date. Do not fill until: 07/25/15 To last until: 08/24/15  . methadone (DOLOPHINE) 10 MG tablet    Sig: Take 2 tablets (20 mg total) by mouth every 8 (eight) hours.    Dispense:  180 tablet    Refill:  0    Do not place this medication, or any other prescription from our practice, on "Automatic Refill". Patient may have prescription filled one  day early if pharmacy is closed on scheduled refill date. Do not fill until: 08/24/15 To last until: 09/23/15  . lubiprostone (AMITIZA) 24 MCG capsule    Sig: Take 1 capsule (24 mcg total) by mouth 2 (two) times daily with a meal. Swallow the medication whole. Do not break or chew the medication.    Dispense:  60 capsule    Refill:  1    Do not place this medication, or any other prescription from our practice, on "Automatic Refill". Patient may have  prescription filled one day early if pharmacy is closed on scheduled refill date.  . Wheat Dextrin (BENEFIBER) POWD    Sig: Stir 2 tsp. TID into 4-8 oz of any non-carbonated beverage or soft food (hot or cold)    Dispense:  500 g    Refill:  1    Do not place this medication, or any other prescription from our practice, on "Automatic Refill". Patient may have prescription filled one day early if pharmacy is closed on scheduled refill date.  . pregabalin (LYRICA) 150 MG capsule    Sig: Take 1 capsule (150 mg total) by mouth 2 (two) times daily.    Dispense:  60 capsule    Refill:  1    Do not place this medication, or any other prescription from our practice, on "Automatic Refill". Patient may have prescription filled one day early if pharmacy is closed on scheduled refill date.  . Naloxone HCl (NARCAN) 4 MG/0.1ML LIQD    Sig: Place 1 spray into the nose once. Spray half of bottle content into each nostril, then call 911    Dispense:  2 each    Refill:  0    Please instruct patient in the proper use of the medication.    Lab-work & Procedure Ordered: Orders Placed This Encounter  Procedures  . MR Cervical Spine Wo Contrast    Standing Status: Future     Number of Occurrences:      Standing Expiration Date: 09/17/2016    Scheduling Instructions:     Please provide canal diameter in millimeters when describing any spinal stenosis.    Order Specific Question:  Reason for Exam (SYMPTOM  OR DIAGNOSIS REQUIRED)    Answer:  Cervical radiculitis/radiculopathy.    Order Specific Question:  Preferred imaging location?    Answer:  [facility]    Order Specific Question:  Does the patient have a pacemaker or implanted devices?    Answer:  No    Order Specific Question:  What is the patient's sedation requirement?    Answer:  No Sedation    Order Specific Question:  Call Results- Best Contact Number?    Answer:  [id number] (Pain Clinic facility) (Dr. Dossie Arbour)  . NCV with  EMG(electromyography)    Bilateral testing requested.    Standing Status: Future     Number of Occurrences:      Standing Expiration Date: 07/18/2016    Scheduling Instructions:     Please refer this patient to [facility] for Nerve Conduction testing of the upper extremities. (EMG & PNCV)    Order Specific Question:  Where should this test be performed?    Answer:  Other    Imaging Ordered: MR CERVICAL SPINE WO CONTRAST  Interventional Therapies: Scheduled: None at this point. PRN Procedures: Right cervical epidural steroid injection under fluoroscopic guidance and IV sedation.    Referral(s) or Consult(s): None at this time, but considering a referral to neurology for  nerve conduction testing.  Medications administered during this visit: Craig Alvarado had no medications administered during this visit.  Future Appointments Date Time Provider [place]  09/15/2015 9:20 AM Milinda Pointer, MD [facility] None    Primary Care Physician: Fritzi Mandes, MD Location: [facility] Note by: Kathlen Brunswick Dossie Arbour, M.D, DABA, DABAPM, DABPM, DABIPP, FIPP  Pain Score Disclaimer: We use the NRS-11 scale. This is a self-reported, subjective measurement of pain severity with only modest accuracy. It is used primarily to identify changes within a particular patient. It must be understood that outpatient pain scales are significantly less accurate that those used for research, where they can be applied under ideal controlled circumstances with minimal exposure to variables. In reality, the score is likely to be a combination of pain intensity and pain affect, where pain affect describes the degree of emotional arousal or changes in action readiness caused by the sensory experience of pain. Factors such as social and work situation, setting, emotional state, anxiety levels, expectation, and prior pain experience may influence pain perception and show  large inter-individual differences that may also be affected by time variables.

## 2015-07-19 NOTE — Patient Instructions (Signed)
Naloxone nasal spray What is this medicine? NALOXONE (nal OX one) is a narcotic blocker. It is used to treat narcotic drug overdose. This medicine may be used for other purposes; ask your health care provider or pharmacist if you have questions. What should I tell my health care provider before I take this medicine? They need to know if you have any of these conditions: -drug abuse or addiction -heart disease -an unusual or allergic reaction to naloxone, other medicines, foods, dyes, or preservatives -pregnant or trying to get pregnant -breast-feeding How should I use this medicine? This medicine is for use in the nose. Lay the person on their back. Support their neck with your hand and allow the head to tilt back before giving the medicine. The nasal spray should be given into 1 nostril. After giving the medicine, move the person onto their side. Do not remove or test the nasal spray until ready to use. Get emergency medical help right away after giving the first dose of this medicine, even if the person wakes up. You should be familiar with how to recognize the signs and symptoms of a narcotic overdose. If more doses are needed, give the additional dose in the other nostril. Talk to your pediatrician regarding the use of this medicine in children. While this drug may be prescribed for children as young as newborns for selected conditions, precautions do apply. Overdosage: If you think you have taken too much of this medicine contact a poison control center or emergency room at once. NOTE: This medicine is only for you. Do not share this medicine with others. What if I miss a dose? This does not apply. What may interact with this medicine? This medicine is only used during an emergency. No interactions are expected during emergency use. This list may not describe all possible interactions. Give your health care provider a list of all the medicines, herbs, non-prescription drugs, or dietary  supplements you use. Also tell them if you smoke, drink alcohol, or use illegal drugs. Some items may interact with your medicine. What should I watch for while using this medicine? Keep this medicine ready for use in the case of a narcotic overdose. Make sure that you have the phone number of your doctor or health care professional and local hospital ready. You may need to have additional doses of this medicine. Each nasal spray contains a single dose. Some emergencies may require additional doses. After use, bring the treated person to the nearest hospital or call 911. Make sure the treating health care professional knows that the person has received an injection of this medicine. You will receive additional instructions on what to do during and after use of this medicine before an emergency occurs. What side effects may I notice from receiving this medicine? Side effects that you should report to your doctor or health care professional as soon as possible: -allergic reactions like skin rash, itching or hives, swelling of the face, lips, or tongue -breathing problems -fast, irregular heartbeat -high blood pressure -pain that was controlled by narcotic pain medicine -seizures Side effects that usually do not require medical attention (report these to your doctor or health care professional if they continue or are bothersome): -anxious -chills -diarrhea -fever -headache -muscle pain -nausea, vomiting -nose irritation like dryness, congestion, and swelling -sweating This list may not describe all possible side effects. Call your doctor for medical advice about side effects. You may report side effects to FDA at 1-800-FDA-1088. Where should I keep my   medicine? Keep out of the reach of children. Store between 4 and 40 degrees C (39 and 104 degrees F). Do not freeze. Throw away any unused medicine after the expiration date. Keep in original box until ready to use. NOTE: This sheet is a summary.  It may not cover all possible information. If you have questions about this medicine, talk to your doctor, pharmacist, or health care provider.    2016, [employer] Standard. (2014-03-27 14:24:03)  

## 2015-07-20 NOTE — Telephone Encounter (Signed)
Amitiza was e-scribed yesterday. Pharmacy notified.

## 2015-09-13 DIAGNOSIS — M79671 Pain in right foot: Secondary | ICD-10-CM | POA: Insufficient documentation

## 2015-09-15 ENCOUNTER — Encounter: Payer: Medicare Other | Admitting: Pain Medicine

## 2015-09-16 ENCOUNTER — Encounter: Payer: Self-pay | Admitting: Pain Medicine

## 2015-09-16 ENCOUNTER — Ambulatory Visit: Payer: Medicare Other | Attending: Pain Medicine | Admitting: Pain Medicine

## 2015-09-16 VITALS — BP 146/64 | HR 40 | Temp 98.4°F | Resp 18 | Ht 70.0 in | Wt 200.0 lb

## 2015-09-16 DIAGNOSIS — M5412 Radiculopathy, cervical region: Secondary | ICD-10-CM | POA: Insufficient documentation

## 2015-09-16 DIAGNOSIS — G5600 Carpal tunnel syndrome, unspecified upper limb: Secondary | ICD-10-CM | POA: Diagnosis not present

## 2015-09-16 DIAGNOSIS — M4802 Spinal stenosis, cervical region: Secondary | ICD-10-CM | POA: Diagnosis not present

## 2015-09-16 DIAGNOSIS — Z5181 Encounter for therapeutic drug level monitoring: Secondary | ICD-10-CM

## 2015-09-16 DIAGNOSIS — G479 Sleep disorder, unspecified: Secondary | ICD-10-CM | POA: Diagnosis not present

## 2015-09-16 DIAGNOSIS — F1290 Cannabis use, unspecified, uncomplicated: Secondary | ICD-10-CM | POA: Diagnosis not present

## 2015-09-16 DIAGNOSIS — Z79891 Long term (current) use of opiate analgesic: Secondary | ICD-10-CM | POA: Diagnosis not present

## 2015-09-16 DIAGNOSIS — M791 Myalgia: Secondary | ICD-10-CM | POA: Diagnosis not present

## 2015-09-16 DIAGNOSIS — R413 Other amnesia: Secondary | ICD-10-CM | POA: Diagnosis not present

## 2015-09-16 DIAGNOSIS — M79601 Pain in right arm: Secondary | ICD-10-CM | POA: Diagnosis not present

## 2015-09-16 DIAGNOSIS — R51 Headache: Secondary | ICD-10-CM | POA: Insufficient documentation

## 2015-09-16 DIAGNOSIS — F1190 Opioid use, unspecified, uncomplicated: Secondary | ICD-10-CM

## 2015-09-16 DIAGNOSIS — K5903 Drug induced constipation: Secondary | ICD-10-CM | POA: Insufficient documentation

## 2015-09-16 DIAGNOSIS — E782 Mixed hyperlipidemia: Secondary | ICD-10-CM | POA: Insufficient documentation

## 2015-09-16 DIAGNOSIS — G43909 Migraine, unspecified, not intractable, without status migrainosus: Secondary | ICD-10-CM | POA: Diagnosis not present

## 2015-09-16 DIAGNOSIS — K219 Gastro-esophageal reflux disease without esophagitis: Secondary | ICD-10-CM | POA: Insufficient documentation

## 2015-09-16 DIAGNOSIS — I10 Essential (primary) hypertension: Secondary | ICD-10-CM | POA: Diagnosis not present

## 2015-09-16 DIAGNOSIS — M79603 Pain in arm, unspecified: Secondary | ICD-10-CM | POA: Diagnosis present

## 2015-09-16 DIAGNOSIS — Z86010 Personal history of colonic polyps: Secondary | ICD-10-CM | POA: Diagnosis not present

## 2015-09-16 DIAGNOSIS — M961 Postlaminectomy syndrome, not elsewhere classified: Secondary | ICD-10-CM

## 2015-09-16 DIAGNOSIS — E559 Vitamin D deficiency, unspecified: Secondary | ICD-10-CM

## 2015-09-16 DIAGNOSIS — T402X5A Adverse effect of other opioids, initial encounter: Secondary | ICD-10-CM

## 2015-09-16 DIAGNOSIS — G8929 Other chronic pain: Secondary | ICD-10-CM

## 2015-09-16 DIAGNOSIS — M25519 Pain in unspecified shoulder: Secondary | ICD-10-CM | POA: Diagnosis present

## 2015-09-16 DIAGNOSIS — E291 Testicular hypofunction: Secondary | ICD-10-CM | POA: Insufficient documentation

## 2015-09-16 DIAGNOSIS — M792 Neuralgia and neuritis, unspecified: Secondary | ICD-10-CM

## 2015-09-16 DIAGNOSIS — Z87891 Personal history of nicotine dependence: Secondary | ICD-10-CM | POA: Insufficient documentation

## 2015-09-16 DIAGNOSIS — M25511 Pain in right shoulder: Secondary | ICD-10-CM | POA: Insufficient documentation

## 2015-09-16 MED ORDER — BENEFIBER PO POWD: ORAL | Status: DC

## 2015-09-16 MED ORDER — METHADONE HCL 10 MG PO TABS: 20.0000 mg | ORAL_TABLET | Freq: Three times a day (TID) | ORAL | Status: DC

## 2015-09-16 MED ORDER — LUBIPROSTONE 24 MCG PO CAPS: 24.0000 ug | ORAL_CAPSULE | Freq: Two times a day (BID) | ORAL | Status: DC

## 2015-09-16 MED ORDER — PREGABALIN 150 MG PO CAPS: 150.0000 mg | ORAL_CAPSULE | Freq: Two times a day (BID) | ORAL | Status: DC

## 2015-09-16 NOTE — Patient Instructions (Signed)
GENERAL RISKS AND COMPLICATIONS  What are the risk, side effects and possible complications? Generally speaking, most procedures are safe.  However, with any procedure there are risks, side effects, and the possibility of complications.  The risks and complications are dependent upon the sites that are lesioned, or the type of nerve block to be performed.  The closer the procedure is to the spine, the more serious the risks are.  Great care is taken when placing the radio frequency needles, block needles or lesioning probes, but sometimes complications can occur. 1. Infection: Any time there is an injection through the skin, there is a risk of infection.  This is why sterile conditions are used for these blocks.  There are four possible types of infection. 1. Localized skin infection. 2. Central Nervous System Infection-This can be in the form of Meningitis, which can be deadly. 3. Epidural Infections-This can be in the form of an epidural abscess, which can cause pressure inside of the spine, causing compression of the spinal cord with subsequent paralysis. This would require an emergency surgery to decompress, and there are no guarantees that the patient would recover from the paralysis. 4. Discitis-This is an infection of the intervertebral discs.  It occurs in about 1% of discography procedures.  It is difficult to treat and it may lead to surgery.        2. Pain: the needles have to go through skin and soft tissues, will cause soreness.       3. Damage to internal structures:  The nerves to be lesioned may be near blood vessels or    other nerves which can be potentially damaged.       4. Bleeding: Bleeding is more common if the patient is taking blood thinners such as  aspirin, Coumadin, Ticiid, Plavix, etc., or if he/she have some genetic predisposition  such as hemophilia. Bleeding into the spinal canal can cause compression of the spinal  cord with subsequent paralysis.  This would require an  emergency surgery to  decompress and there are no guarantees that the patient would recover from the  paralysis.       5. Pneumothorax:  Puncturing of a lung is a possibility, every time a needle is introduced in  the area of the chest or upper back.  Pneumothorax refers to free air around the  collapsed lung(s), inside of the thoracic cavity (chest cavity).  Another two possible  complications related to a similar event would include: Hemothorax and Chylothorax.   These are variations of the Pneumothorax, where instead of air around the collapsed  lung(s), you may have blood or chyle, respectively.       6. Spinal headaches: They may occur with any procedures in the area of the spine.       7. Persistent CSF (Cerebro-Spinal Fluid) leakage: This is a rare problem, but may occur  with prolonged intrathecal or epidural catheters either due to the formation of a fistulous  track or a dural tear.       8. Nerve damage: By working so close to the spinal cord, there is always a possibility of  nerve damage, which could be as serious as a permanent spinal cord injury with  paralysis.       9. Death:  Although rare, severe deadly allergic reactions known as "Anaphylactic  reaction" can occur to any of the medications used.      10. Worsening of the symptoms:  We can always make thing worse.    What are the chances of something like this happening? Chances of any of this occuring are extremely low.  By statistics, you have more of a chance of getting killed in a motor vehicle accident: while driving to the hospital than any of the above occurring .  Nevertheless, you should be aware that they are possibilities.  In general, it is similar to taking a shower.  Everybody knows that you can slip, hit your head and get killed.  Does that mean that you should not shower again?  Nevertheless always keep in mind that statistics do not mean anything if you happen to be on the wrong side of them.  Even if a procedure has a 1  (one) in a 1,000,000 (million) chance of going wrong, it you happen to be that one..Also, keep in mind that by statistics, you have more of a chance of having something go wrong when taking medications.  Who should not have this procedure? If you are on a blood thinning medication (e.g. Coumadin, Plavix, see list of "Blood Thinners"), or if you have an active infection going on, you should not have the procedure.  If you are taking any blood thinners, please inform your physician.  How should I prepare for this procedure?  Do not eat or drink anything at least six hours prior to the procedure.  Bring a driver with you .  It cannot be a taxi.  Come accompanied by an adult that can drive you back, and that is strong enough to help you if your legs get weak or numb from the local anesthetic.  Take all of your medicines the morning of the procedure with just enough water to swallow them.  If you have diabetes, make sure that you are scheduled to have your procedure done first thing in the morning, whenever possible.  If you have diabetes, take only half of your insulin dose and notify our nurse that you have done so as soon as you arrive at the clinic.  If you are diabetic, but only take blood sugar pills (oral hypoglycemic), then do not take them on the morning of your procedure.  You may take them after you have had the procedure.  Do not take aspirin or any aspirin-containing medications, at least eleven (11) days prior to the procedure.  They may prolong bleeding.  Wear loose fitting clothing that may be easy to take off and that you would not mind if it got stained with Betadine or blood.  Do not wear any jewelry or perfume  Remove any nail coloring.  It will interfere with some of our monitoring equipment.  NOTE: Remember that this is not meant to be interpreted as a complete list of all possible complications.  Unforeseen problems may occur.  BLOOD THINNERS The following drugs  contain aspirin or other products, which can cause increased bleeding during surgery and should not be taken for 2 weeks prior to and 1 week after surgery.  If you should need take something for relief of minor pain, you may take acetaminophen which is found in Tylenol,m Datril, Anacin-3 and Panadol. It is not blood thinner. The products listed below are.  Do not take any of the products listed below in addition to any listed on your instruction sheet.  A.P.C or A.P.C with Codeine Codeine Phosphate Capsules #3 Ibuprofen Ridaura  ABC compound Congesprin Imuran rimadil  Advil Cope Indocin Robaxisal  Alka-Seltzer Effervescent Pain Reliever and Antacid Coricidin or Coricidin-D  Indomethacin Rufen    Alka-Seltzer plus Cold Medicine Cosprin Ketoprofen S-A-C Tablets  Anacin Analgesic Tablets or Capsules Coumadin Korlgesic Salflex  Anacin Extra Strength Analgesic tablets or capsules CP-2 Tablets Lanoril Salicylate  Anaprox Cuprimine Capsules Levenox Salocol  Anexsia-D Dalteparin Magan Salsalate  Anodynos Darvon compound Magnesium Salicylate Sine-off  Ansaid Dasin Capsules Magsal Sodium Salicylate  Anturane Depen Capsules Marnal Soma  APF Arthritis pain formula Dewitt's Pills Measurin Stanback  Argesic Dia-Gesic Meclofenamic Sulfinpyrazone  Arthritis Bayer Timed Release Aspirin Diclofenac Meclomen Sulindac  Arthritis pain formula Anacin Dicumarol Medipren Supac  Analgesic (Safety coated) Arthralgen Diffunasal Mefanamic Suprofen  Arthritis Strength Bufferin Dihydrocodeine Mepro Compound Suprol  Arthropan liquid Dopirydamole Methcarbomol with Aspirin Synalgos  ASA tablets/Enseals Disalcid Micrainin Tagament  Ascriptin Doan's Midol Talwin  Ascriptin A/D Dolene Mobidin Tanderil  Ascriptin Extra Strength Dolobid Moblgesic Ticlid  Ascriptin with Codeine Doloprin or Doloprin with Codeine Momentum Tolectin  Asperbuf Duoprin Mono-gesic Trendar  Aspergum Duradyne Motrin or Motrin IB Triminicin  Aspirin  plain, buffered or enteric coated Durasal Myochrisine Trigesic  Aspirin Suppositories Easprin Nalfon Trillsate  Aspirin with Codeine Ecotrin Regular or Extra Strength Naprosyn Uracel  Atromid-S Efficin Naproxen Ursinus  Auranofin Capsules Elmiron Neocylate Vanquish  Axotal Emagrin Norgesic Verin  Azathioprine Empirin or Empirin with Codeine Normiflo Vitamin E  Azolid Emprazil Nuprin Voltaren  Bayer Aspirin plain, buffered or children's or timed BC Tablets or powders Encaprin Orgaran Warfarin Sodium  Buff-a-Comp Enoxaparin Orudis Zorpin  Buff-a-Comp with Codeine Equegesic Os-Cal-Gesic   Buffaprin Excedrin plain, buffered or Extra Strength Oxalid   Bufferin Arthritis Strength Feldene Oxphenbutazone   Bufferin plain or Extra Strength Feldene Capsules Oxycodone with Aspirin   Bufferin with Codeine Fenoprofen Fenoprofen Pabalate or Pabalate-SF   Buffets II Flogesic Panagesic   Buffinol plain or Extra Strength Florinal or Florinal with Codeine Panwarfarin   Buf-Tabs Flurbiprofen Penicillamine   Butalbital Compound Four-way cold tablets Penicillin   Butazolidin Fragmin Pepto-Bismol   Carbenicillin Geminisyn Percodan   Carna Arthritis Reliever Geopen Persantine   Carprofen Gold's salt Persistin   Chloramphenicol Goody's Phenylbutazone   Chloromycetin Haltrain Piroxlcam   Clmetidine heparin Plaquenil   Cllnoril Hyco-pap Ponstel   Clofibrate Hydroxy chloroquine Propoxyphen         Before stopping any of these medications, be sure to consult the physician who ordered them.  Some, such as Coumadin (Warfarin) are ordered to prevent or treat serious conditions such as "deep thrombosis", "pumonary embolisms", and other heart problems.  The amount of time that you may need off of the medication may also vary with the medication and the reason for which you were taking it.  If you are taking any of these medications, please make sure you notify your pain physician before you undergo any  procedures.         Epidural Steroid Injection Patient Information  Description: The epidural space surrounds the nerves as they exit the spinal cord.  In some patients, the nerves can be compressed and inflamed by a bulging disc or a tight spinal canal (spinal stenosis).  By injecting steroids into the epidural space, we can bring irritated nerves into direct contact with a potentially helpful medication.  These steroids act directly on the irritated nerves and can reduce swelling and inflammation which often leads to decreased pain.  Epidural steroids may be injected anywhere along the spine and from the neck to the low back depending upon the location of your pain.   After numbing the skin with local anesthetic (like Novocaine), a small needle is passed   into the epidural space slowly.  You may experience a sensation of pressure while this is being done.  The entire block usually last less than 10 minutes.  Conditions which may be treated by epidural steroids:   Low back and leg pain  Neck and arm pain  Spinal stenosis  Post-laminectomy syndrome  Herpes zoster (shingles) pain  Pain from compression fractures  Preparation for the injection:  1. Do not eat any solid food or dairy products within 8 hours of your appointment.  2. You may drink clear liquids up to 3 hours before appointment.  Clear liquids include water, black coffee, juice or soda.  No milk or cream please. 3. You may take your regular medication, including pain medications, with a sip of water before your appointment  Diabetics should hold regular insulin (if taken separately) and take 1/2 normal NPH dos the morning of the procedure.  Carry some sugar containing items with you to your appointment. 4. A driver must accompany you and be prepared to drive you home after your procedure.  5. Bring all your current medications with your. 6. An IV may be inserted and sedation may be given at the discretion of the  physician.   7. A blood pressure cuff, EKG and other monitors will often be applied during the procedure.  Some patients may need to have extra oxygen administered for a short period. 8. You will be asked to provide medical information, including your allergies, prior to the procedure.  We must know immediately if you are taking blood thinners (like Coumadin/Warfarin)  Or if you are allergic to IV iodine contrast (dye). We must know if you could possible be pregnant.  Possible side-effects:  Bleeding from needle site  Infection (rare, may require surgery)  Nerve injury (rare)  Numbness & tingling (temporary)  Difficulty urinating (rare, temporary)  Spinal headache ( a headache worse with upright posture)  Light -headedness (temporary)  Pain at injection site (several days)  Decreased blood pressure (temporary)  Weakness in arm/leg (temporary)  Pressure sensation in back/neck (temporary)  Call if you experience:  Fever/chills associated with headache or increased back/neck pain.  Headache worsened by an upright position.  New onset weakness or numbness of an extremity below the injection site  Hives or difficulty breathing (go to the emergency room)  Inflammation or drainage at the infection site  Severe back/neck pain  Any new symptoms which are concerning to you  Please note:  Although the local anesthetic injected can often make your back or neck feel good for several hours after the injection, the pain will likely return.  It takes 3-7 days for steroids to work in the epidural space.  You may not notice any pain relief for at least that one week.  If effective, we will often do a series of three injections spaced 3-6 weeks apart to maximally decrease your pain.  After the initial series, we generally will wait several months before considering a repeat injection of the same type.  If you have any questions, please call [phone] [facility] 

## 2015-09-16 NOTE — Progress Notes (Signed)
Patient's Name: Craig Alvarado  Patient type: Established  MRN: [id number]  Service setting: Ambulatory outpatient  DOB: June 15, 1945  Location: [facility]  DOS: 09/16/2015  Primary Care Physician: Craig Mandes, MD  Note by: Craig Alvarado, M.D, DABA, DABAPM, DABPM, DABIPP, FIPP  Referring Physician: Fritzi Mandes, MD  Specialty: Board-Certified Interventional Pain Management  Last Visit to Pain Management: 07/19/2015   Primary Reason(s) for Visit: Encounter for prescription drug management (Level of risk: moderate) CC: Shoulder Pain and Arm Pain   HPI  [name] is a 70 y.o. year old, male patient, who returns today as an established patient. 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[place]); Encounter for therapeutic drug level monitoring; Encounter for chronic pain management; Opioid dependence ([place]); Opioid-induced constipation (OIC); Chronic cervical radicular pain (Right); 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 His primarily concern today is the Shoulder Pain and Arm Pain   Pain Assessment: Self-Reported Pain Score: 4  Reported level is compatible with observation Pain Type:  Chronic pain Pain Location: Shoulder Pain Orientation: Right Pain Descriptors / Indicators: Aching, Constant Pain Frequency: Constant  The patient comes into the clinics today for pharmacological management of his chronic pain. I last saw this patient on 07/19/2015. The patient  reports that he does not use illicit drugs. His body mass index is 28.7 kg/(m^2).  Date of Last Visit: 07/19/15 Service Provided on Last Visit: Med Refill  Controlled Substance Pharmacotherapy Assessment & REMS (Risk Evaluation and Mitigation Strategy)  Analgesic: Methadone 10 mg 2 tablets every 8 hours (60 mg/day) Pill Count: Methadone # 36/180 Filled 08-23-15. MME/day: 600 mg/day Date of Last Visit: 07/19/15 Pharmacokinetics: Onset of action (Liberation/Absorption): Within expected pharmacological parameters Time to Peak effect (Distribution): Timing and results are as within normal expected parameters Duration of action (Metabolism/Excretion): Within normal limits for medication Pharmacodynamics: Analgesic Effect: More than 50% Activity Facilitation: Medication(s) allow patient to sit, stand, walk, and do the basic ADLs Perceived Effectiveness: Described as relatively effective, allowing for increase in activities of daily living (ADL) Side-effects or Adverse reactions: None reported Monitoring: [place] PMP: Online review of the past 46-month period conducted. Compliant with practice rules and regulations Last UDS on record: TOXASSURE SELECT 13  Date Value Ref Range Status  05/24/2015 FINAL  Final    Comment:    ==================================================================== TOXASSURE SELECT 13 (MW) ==================================================================== Test                             Result       Flag       Units Drug Present and Declared for Prescription Verification   Methadone                      2085         EXPECTED  ng/mg creat   EDDP (Methadone Mtb)           5705          EXPECTED   ng/mg creat    Sources of methadone include scheduled prescription medications.    EDDP is an expected metabolite of methadone. ==================================================================== Test                      Result    Flag   Units      Ref Range   Creatinine              106              mg/dL      >=20 ==================================================================== Declared Medications:  The flagging and interpretation on this report are based on the  following declared medications.  Unexpected results may arise from  inaccuracies in the declared medications.  **Note: The testing scope of this panel includes these medications:  Methadone  **Note: The testing scope of this panel does not include following  reported medications:  Pregabalin (Lyrica) ==================================================================== For clinical consultation, please call [phone]. ====================================================================    UDS interpretation: Compliant Medication Assessment Form: Reviewed. Patient indicates being compliant with therapy Treatment compliance: Compliant Risk Assessment: Aberrant Behavior: None observed today Substance Use Disorder (SUD) Risk Level: High Risk of opioid abuse or dependence: 0.7-3.0% with doses ? 36 MME/day and 6.1-26% with doses ? 120 MME/day. Opioid Risk Tool (ORT) Score: Total Score: 0 Low Risk for SUD (Score <3) Depression Scale Score: PHQ-2: PHQ-2 Total Score: 0 No depression (0) PHQ-9: PHQ-9 Total Score: 0 No depression (0-4)  Pharmacologic Plan: No change in therapy, at this time  Laboratory Chemistry  Inflammation Markers Lab Results  Component Value Date   ESRSEDRATE 11 03/29/2015   CRP 0.5 03/29/2015    Renal Function Lab Results  Component Value Date   BUN 24* 03/29/2015   CREATININE 0.89 03/29/2015   GFRAA >60 03/29/2015   GFRNONAA >60 03/29/2015    Hepatic Function Lab  Results  Component Value Date   AST 26 03/29/2015   ALT 16* 03/29/2015   ALBUMIN 3.9 03/29/2015    Electrolytes Lab Results  Component Value Date   NA 141 03/29/2015   K 4.6 03/29/2015   CL 107 03/29/2015   CALCIUM 9.0 03/29/2015   MG 2.2 03/29/2015    Pain Modulating Vitamins No results found for: [place], [id number], [id number], [id number], [id number]  Coagulation Parameters No results found for: INR, LABPROT  Note: I personally reviewed the above data. Results made available to patient.  Recent Diagnostic Imaging  No results found.  Meds  The patient has a current medication list which includes the following prescription(s): aspirin, atorvastatin, atorvastatin, vitamin d3, docusate sodium, finasteride, lubiprostone, magnesium oxide, methadone, methadone, naloxone hcl, naproxen, pregabalin, promethazine, sumatriptan, tamsulosin, and benefiber.  Current Outpatient Prescriptions on File Prior to Visit  Medication Sig  . aspirin 81 MG tablet Take 81 mg by mouth daily.  Marland Kitchen atorvastatin (LIPITOR) 20 MG tablet Take 20 mg by mouth daily.  . Cholecalciferol (VITAMIN D3) 2000 units capsule Take 1 capsule (2,000 Units total) by mouth daily.  Marland Kitchen docusate sodium (STOOL SOFTENER) 100 MG capsule Take by mouth.  . finasteride (PROSCAR) 5 MG tablet Take 5 mg by mouth daily.  . magnesium oxide (MAG-OX) 400 MG tablet Take 1 tablet (400 mg total) by mouth daily.  . Naloxone HCl (NARCAN) 4 MG/0.1ML LIQD Place 1 spray into the nose  once. Spray half of bottle content into each nostril, then call 911  . promethazine (PHENERGAN) 25 MG tablet Take 25 mg by mouth every 6 (six) hours as needed for nausea or vomiting.  . SUMAtriptan (IMITREX) 50 MG tablet Take by mouth as needed.   . tamsulosin (FLOMAX) 0.4 MG CAPS capsule Take 0.4 mg by mouth daily.   No current facility-administered medications on file prior to visit.    ROS  Constitutional: Denies any fever or chills Gastrointestinal: No  reported hemesis, hematochezia, vomiting, or acute GI distress Musculoskeletal: Denies any acute onset joint swelling, redness, loss of ROM, or weakness Neurological: No reported episodes of acute onset apraxia, aphasia, dysarthria, agnosia, amnesia, paralysis, loss of coordination, or loss of consciousness  Allergies  Mr. Lawery has No Known Allergies.  [place]  Medical:  Mr. 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 Family: family history includes Alcohol abuse in his father; Alzheimer's disease in his mother; Cancer in his father. Surgical:  has past surgical history that includes Spine surgery. Tobacco:  reports that he quit smoking about 9 months ago. He does not have any smokeless tobacco history on file. Alcohol:  reports that he drinks alcohol. Drug:  reports that he does not use illicit drugs.  Constitutional Exam  Vitals: Blood pressure 146/64, pulse 40, temperature 98.4 F (36.9 C), resp. rate 18, height 5\' 10"  (1.778 m), weight 200 lb (90.719 kg), SpO2 97 %. General appearance: Well nourished, well developed, and well hydrated. In no acute distress Calculated BMI/Body habitus: Body mass index is 28.7 kg/(m^2). (25-29.9 kg/m2) Overweight - 20% higher incidence of chronic pain Psych/Mental status: Alert and oriented x 3 (person, place, & time) Eyes: PERLA Respiratory: No evidence of acute respiratory distress  Cervical Spine Exam  Inspection: No masses, redness, or swelling Alignment: Symmetrical ROM: Functional: Diminished  ROM Stability: No instability detected Muscle strength & Tone: Functionally intact Sensory: Unimpaired Palpation: Tender  Upper Extremity (UE) Exam    Side: Right upper extremity  Side: Left upper extremity  Inspection: No masses, redness, swelling, or asymmetry  Inspection: No masses, redness, swelling, or asymmetry  ROM:  ROM:  Functional: ROM is within functional limits [facility])  Functional: ROM is within functional limits [facility]  Muscle strength & Tone: Functionally intact  Muscle strength & Tone: Functionally intact  Sensory: Pain pattern appears Dermatomal  Sensory: Unimpaired  Palpation: Non-contributory  Palpation: Non-contributory   Thoracic Spine Exam  Inspection: No masses, redness, or swelling Alignment: Symmetrical ROM: Functional: ROM is within functional limits [facility]) Stability: No instability detected Sensory: Unimpaired Muscle strength & Tone: Functionally intact Palpation: No complaints of tenderness  Lumbar Spine Exam  Inspection: No masses, redness, or swelling Alignment: Symmetrical ROM: Functional: ROM is within functional limits [facility]) Stability: No instability detected Muscle strength & Tone: Functionally intact Sensory: Unimpaired Palpation: No complaints of tenderness Provocative Tests: Lumbar Hyperextension and rotation test: deferred Patrick's Maneuver: deferred  Gait & Posture Assessment  Ambulation: Unassisted Gait: Unaffected Posture: WNL  Lower Extremity Exam    Side: Right lower extremity  Side: Left lower extremity  Inspection: No masses, redness, swelling, or asymmetry ROM:  Inspection: No masses, redness, swelling, or asymmetry ROM:  Functional: ROM is  within functional limits [facility])  Functional: ROM is within functional limits [facility])  Muscle strength & Tone: Functionally intact  Muscle strength & Tone: Functionally intact  Sensory: Unimpaired  Sensory: Unimpaired  Palpation: Non-contributory  Palpation: Non-contributory   Assessment &  Plan  Primary Diagnosis & Pertinent Problem List: The primary encounter diagnosis was Chronic pain. 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  Visit Diagnosis: 1. Chronic pain   2. Encounter for therapeutic drug level monitoring   3. Long term current use of opiate analgesic   4. Opiate use (600 MME/Day)   5. 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 Chronic cervical radicular pain (Right)   8. Neurogenic pain   9. Therapeutic opioid-induced constipation (OIC)   10. Vitamin D insufficiency     Problems updated and reviewed during this visit: Problem  Opiate use (600 MME/Day)   Analgesic: Methadone 10 mg 2 tablets every 8 hours (60 mg/day)   Heel Pain   Last Assessment & Plan:  Heel pad syndrome vs plantar fasciitis vs spur. Provided HEP, home care instructions, and return precautions. Naprosyn 500mg  BID for 1 week followed by PRN use. Will consider podiatry referral if no improvement after 6-8 weeks.   Foot Pain    Problem-specific Plan(s): No problem-specific assessment & plan notes found for this encounter.  No new assessment & plan notes have been filed under this hospital service since the last note was generated. 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 methadone (DOLOPHINE) 10 MG tablet    Sig: Take 2 tablets (20 mg total) by mouth every 8 (eight) hours.    Dispense:  180 tablet    Refill:  0    Do not place this medication, or any other prescription from our practice, on "Automatic Refill". Patient may have prescription filled one day early if pharmacy is  closed on scheduled refill date. Do not fill until: 09/23/15 To last until: 10/23/15  . methadone (DOLOPHINE) 10 MG tablet    Sig: Take 2 tablets (20 mg total) by mouth every 8 (eight) hours.    Dispense:  180 tablet    Refill:  0    Do not place this medication, or any other prescription from our practice, on "Automatic Refill". Patient may have prescription filled one day early if pharmacy is closed on scheduled refill date. Do not fill until: 10/23/15 To last until: 11/22/15  . pregabalin (LYRICA) 150 MG capsule    Sig: Take 1 capsule (150 mg total) by mouth 2 (two) times daily.    Dispense:  60 capsule    Refill:  1    Do not place this medication, or any other prescription from our practice, on  "Automatic Refill". Patient may have prescription filled one day early if pharmacy is closed on scheduled refill date.  . lubiprostone (AMITIZA) 24 MCG capsule    Sig: Take 1 capsule (24 mcg total) by mouth 2 (two) times daily with a meal. Swallow the medication whole. Do not break or chew the medication.    Dispense:  60 capsule    Refill:  1    Do not place this medication, or any other prescription from our practice, on "Automatic Refill". Patient may have prescription filled one day early if pharmacy is closed on scheduled refill date.  . Wheat Dextrin (BENEFIBER) POWD    Sig: Stir 2 tsp. TID into 4-8 oz of any non-carbonated beverage or soft food (hot or cold)    Dispense:  500 g    Refill:  1    Do not place this medication, or any other prescription from our practice, on "Automatic Refill". Patient may have prescription filled one day early if pharmacy is closed on scheduled refill date.    Lab-work & Procedure Ordered: Orders Placed This Encounter  Procedures  . CERVICAL EPIDURAL STEROID INJECTION  . ToxASSURE Select 13 (MW), Urine    Imaging Ordered: None  Interventional Therapies: Scheduled:  None at this point.    Considering:  Cervical epidural steroid injection under fluoroscopic guidance, with or without sedation.    PRN Procedures:  Right sided cervical epidural steroid injection under fluoroscopic guidance, without without sedation.    Referral(s) or Consult(s): None at this time.  New Prescriptions   No medications on file    Medications administered during this visit: Mr. Gerring had no medications administered during this visit.  Requested PM Follow-up: Return in about 7 weeks (around 11/01/2015) for Medication Management, (2-Mo), Procedure (PRN - Patient will call).  Future Appointments Date Time Provider [place]  11/02/2015 9:00 AM Milinda Pointer, MD [facility] None    Primary Care Physician: Craig Mandes, MD Location[facility]  Outpatient Pain Management Facility Note by: Craig Alvarado, M.D, DABA, DABAPM, DABPM, DABIPP, FIPP  Pain Score Disclaimer: We use the NRS-11 scale. This is a self-reported, subjective measurement of pain severity with only modest accuracy. It is used primarily to identify changes within a particular patient. It must be understood that outpatient pain scales are significantly less accurate that those used for research, where they can be applied under ideal controlled circumstances with minimal exposure to variables. In reality, the score is likely to be a combination of pain intensity and pain affect, where pain affect describes the degree of emotional arousal or changes in action readiness caused by the sensory experience of pain.  Factors such as social and work situation, setting, emotional state, anxiety levels, expectation, and prior pain experience may influence pain perception and show large inter-individual differences that may also be affected by time variables.  Patient instructions provided during this appointment: Patient Instructions   GENERAL RISKS AND COMPLICATIONS  What are the risk, side effects and possible complications? Generally speaking, most procedures are safe.  However, with any procedure there are risks, side effects, and the possibility of complications.  The risks and complications are dependent upon the sites that are lesioned, or the type of nerve block to be performed.  The closer the procedure is to the spine, the more serious the risks are.  Great care is taken when placing the radio frequency needles, block needles or lesioning probes, but sometimes complications can occur. 1. Infection: Any time there is an injection through the skin, there is a risk of infection.  This is why sterile conditions are used for these blocks.  There are four possible types of infection. 1. Localized skin infection. 2. Central Nervous System Infection-This can be in the form of  Meningitis, which can be deadly. 3. Epidural Infections-This can be in the form of an epidural abscess, which can cause pressure inside of the spine, causing compression of the spinal cord with subsequent paralysis. This would require an emergency surgery to decompress, and there are no guarantees that the patient would recover from the paralysis. 4. Discitis-This is an infection of the intervertebral discs.  It occurs in about 1% of discography procedures.  It is difficult to treat and it may lead to surgery.        2. Pain: the needles have to go through skin and soft tissues, will cause soreness.       3. Damage to internal structures:  The nerves to be lesioned may be near blood vessels or    other nerves which can be potentially damaged.       4. Bleeding: Bleeding is more common if the patient is taking blood thinners such as  aspirin, Coumadin, Ticiid, Plavix, etc., or if he/she have some genetic predisposition  such as hemophilia. Bleeding into the spinal canal can cause compression of the spinal  cord with subsequent paralysis.  This would require an emergency surgery to  decompress and there are no guarantees that the patient would recover from the  paralysis.       5. Pneumothorax:  Puncturing of a lung is a possibility, every time a needle is introduced in  the area of the chest or upper back.  Pneumothorax refers to free air around the  collapsed lung(s), inside of the thoracic cavity (chest cavity).  Another two possible  complications related to a similar event would include: Hemothorax and Chylothorax.   These are variations of the Pneumothorax, where instead of air around the collapsed  lung(s), you may have blood or chyle, respectively.       6. Spinal headaches: They may occur with any procedures in the area of the spine.       7. Persistent CSF (Cerebro-Spinal Fluid) leakage: This is a rare problem, but may occur  with prolonged intrathecal or epidural catheters either due to the  formation of a fistulous  track or a dural tear.       8. Nerve damage: By working so close to the spinal cord, there is always a possibility of  nerve damage, which could be as serious as a permanent spinal cord injury with  paralysis.       9. Death:  Although rare, severe deadly allergic reactions known as "Anaphylactic  reaction" can occur to any of the medications used.      10. Worsening of the symptoms:  We can always make thing worse.  What are the chances of something like this happening? Chances of any of this occuring are extremely low.  By statistics, you have more of a chance of getting killed in a motor vehicle accident: while driving to the hospital than any of the above occurring .  Nevertheless, you should be aware that they are possibilities.  In general, it is similar to taking a shower.  Everybody knows that you can slip, hit your head and get killed.  Does that mean that you should not shower again?  Nevertheless always keep in mind that statistics do not mean anything if you happen to be on the wrong side of them.  Even if a procedure has a 1 (one) in a 1,000,000 (million) chance of going wrong, it you happen to be that one..Also, keep in mind that by statistics, you have more of a chance of having something go wrong when taking medications.  Who should not have this procedure? If you are on a blood thinning medication (e.g. Coumadin, Plavix, see list of "Blood Thinners"), or if you have an active infection going on, you should not have the procedure.  If you are taking any blood thinners, please inform your physician.  How should I prepare for this procedure?  Do not eat or drink anything at least six hours prior to the procedure.  Bring a driver with you .  It cannot be a taxi.  Come accompanied by an adult that can drive you back, and that is strong enough to help you if your legs get weak or numb from the local anesthetic.  Take all of your medicines the morning of the  procedure with just enough water to swallow them.  If you have diabetes, make sure that you are scheduled to have your procedure done first thing in the morning, whenever possible.  If you have diabetes, take only half of your insulin dose and notify our nurse that you have done so as soon as you arrive at the clinic.  If you are diabetic, but only take blood sugar pills (oral hypoglycemic), then do not take them on the morning of your procedure.  You may take them after you have had the procedure.  Do not take aspirin or any aspirin-containing medications, at least eleven (11) days prior to the procedure.  They may prolong bleeding.  Wear loose fitting clothing that may be easy to take off and that you would not mind if it got stained with Betadine or blood.  Do not wear any jewelry or perfume  Remove any nail coloring.  It will interfere with some of our monitoring equipment.  NOTE: Remember that this is not meant to be interpreted as a complete list of all possible complications.  Unforeseen problems may occur.  BLOOD THINNERS The following drugs contain aspirin or other products, which can cause increased bleeding during surgery and should not be taken for 2 weeks prior to and 1 week after surgery.  If you should need take something for relief of minor pain, you may take acetaminophen which is found in Tylenol,m Datril, Anacin-3 and Panadol. It is not blood thinner. The products listed below are.  Do not take any of the products listed below  in addition to any listed on your instruction sheet.  A.P.C or A.P.C with Codeine Codeine Phosphate Capsules #3 Ibuprofen Ridaura  ABC compound Congesprin Imuran rimadil  Advil Cope Indocin Robaxisal  Alka-Seltzer Effervescent Pain Reliever and Antacid Coricidin or Coricidin-D  Indomethacin Rufen  Alka-Seltzer plus Cold Medicine Cosprin Ketoprofen S-A-C Tablets  Anacin Analgesic Tablets or Capsules Coumadin Korlgesic Salflex  Anacin Extra Strength  Analgesic tablets or capsules CP-2 Tablets Lanoril Salicylate  Anaprox Cuprimine Capsules Levenox Salocol  Anexsia-D Dalteparin Magan Salsalate  Anodynos Darvon compound Magnesium Salicylate Sine-off  Ansaid Dasin Capsules Magsal Sodium Salicylate  Anturane Depen Capsules Marnal Soma  APF Arthritis pain formula Dewitt's Pills Measurin Stanback  Argesic Dia-Gesic Meclofenamic Sulfinpyrazone  Arthritis Bayer Timed Release Aspirin Diclofenac Meclomen Sulindac  Arthritis pain formula Anacin Dicumarol Medipren Supac  Analgesic (Safety coated) Arthralgen Diffunasal Mefanamic Suprofen  Arthritis Strength Bufferin Dihydrocodeine Mepro Compound Suprol  Arthropan liquid Dopirydamole Methcarbomol with Aspirin Synalgos  ASA tablets/Enseals Disalcid Micrainin Tagament  Ascriptin Doan's Midol Talwin  Ascriptin A/D Dolene Mobidin Tanderil  Ascriptin Extra Strength Dolobid Moblgesic Ticlid  Ascriptin with Codeine Doloprin or Doloprin with Codeine Momentum Tolectin  Asperbuf Duoprin Mono-gesic Trendar  Aspergum Duradyne Motrin or Motrin IB Triminicin  Aspirin plain, buffered or enteric coated Durasal Myochrisine Trigesic  Aspirin Suppositories Easprin Nalfon Trillsate  Aspirin with Codeine Ecotrin Regular or Extra Strength Naprosyn Uracel  Atromid-S Efficin Naproxen Ursinus  Auranofin Capsules Elmiron Neocylate Vanquish  Axotal Emagrin Norgesic Verin  Azathioprine Empirin or Empirin with Codeine Normiflo Vitamin E  Azolid Emprazil Nuprin Voltaren  Bayer Aspirin plain, buffered or children's or timed BC Tablets or powders Encaprin Orgaran Warfarin Sodium  Buff-a-Comp Enoxaparin Orudis Zorpin  Buff-a-Comp with Codeine Equegesic Os-Cal-Gesic   Buffaprin Excedrin plain, buffered or Extra Strength Oxalid   Bufferin Arthritis Strength Feldene Oxphenbutazone   Bufferin plain or Extra Strength Feldene Capsules Oxycodone with Aspirin   Bufferin with Codeine Fenoprofen Fenoprofen Pabalate or Pabalate-SF    Buffets II Flogesic Panagesic   Buffinol plain or Extra Strength Florinal or Florinal with Codeine Panwarfarin   Buf-Tabs Flurbiprofen Penicillamine   Butalbital Compound Four-way cold tablets Penicillin   Butazolidin Fragmin Pepto-Bismol   Carbenicillin Geminisyn Percodan   Carna Arthritis Reliever Geopen Persantine   Carprofen Gold's salt Persistin   Chloramphenicol Goody's Phenylbutazone   Chloromycetin Haltrain Piroxlcam   Clmetidine heparin Plaquenil   Cllnoril Hyco-pap Ponstel   Clofibrate Hydroxy chloroquine Propoxyphen         Before stopping any of these medications, be sure to consult the physician who ordered them.  Some, such as Coumadin (Warfarin) are ordered to prevent or treat serious conditions such as "deep thrombosis", "pumonary embolisms", and other heart problems.  The amount of time that you may need off of the medication may also vary with the medication and the reason for which you were taking it.  If you are taking any of these medications, please make sure you notify your pain physician before you undergo any procedures.         Epidural Steroid Injection Patient Information  Description: The epidural space surrounds the nerves as they exit the spinal cord.  In some patients, the nerves can be compressed and inflamed by a bulging disc or a tight spinal canal (spinal stenosis).  By injecting steroids into the epidural space, we can bring irritated nerves into direct contact with a potentially helpful medication.  These steroids act directly on the irritated nerves and can reduce swelling and inflammation which  often leads to decreased pain.  Epidural steroids may be injected anywhere along the spine and from the neck to the low back depending upon the location of your pain.   After numbing the skin with local anesthetic (like Novocaine), a small needle is passed into the epidural space slowly.  You may experience a sensation of pressure while this is being done.   The entire block usually last less than 10 minutes.  Conditions which may be treated by epidural steroids:   Low back and leg pain  Neck and arm pain  Spinal stenosis  Post-laminectomy syndrome  Herpes zoster (shingles) pain  Pain from compression fractures  Preparation for the injection:  1. Do not eat any solid food or dairy products within 8 hours of your appointment.  2. You may drink clear liquids up to 3 hours before appointment.  Clear liquids include water, black coffee, juice or soda.  No milk or cream please. 3. You may take your regular medication, including pain medications, with a sip of water before your appointment  Diabetics should hold regular insulin (if taken separately) and take 1/2 normal NPH dos the morning of the procedure.  Carry some sugar containing items with you to your appointment. 4. A driver must accompany you and be prepared to drive you home after your procedure.  5. Bring all your current medications with your. 6. An IV may be inserted and sedation may be given at the discretion of the physician.   7. A blood pressure cuff, EKG and other monitors will often be applied during the procedure.  Some patients may need to have extra oxygen administered for a short period. 8. You will be asked to provide medical information, including your allergies, prior to the procedure.  We must know immediately if you are taking blood thinners (like Coumadin/Warfarin)  Or if you are allergic to IV iodine contrast (dye). We must know if you could possible be pregnant.  Possible side-effects:  Bleeding from needle site  Infection (rare, may require surgery)  Nerve injury (rare)  Numbness & tingling (temporary)  Difficulty urinating (rare, temporary)  Spinal headache ( a headache worse with upright posture)  Light -headedness (temporary)  Pain at injection site (several days)  Decreased blood pressure (temporary)  Weakness in arm/leg (temporary)  Pressure  sensation in back/neck (temporary)  Call if you experience:  Fever/chills associated with headache or increased back/neck pain.  Headache worsened by an upright position.  New onset weakness or numbness of an extremity below the injection site  Hives or difficulty breathing (go to the emergency room)  Inflammation or drainage at the infection site  Severe back/neck pain  Any new symptoms which are concerning to you  Please note:  Although the local anesthetic injected can often make your back or neck feel good for several hours after the injection, the pain will likely return.  It takes 3-7 days for steroids to work in the epidural space.  You may not notice any pain relief for at least that one week.  If effective, we will often do a series of three injections spaced 3-6 weeks apart to maximally decrease your pain.  After the initial series, we generally will wait several months before considering a repeat injection of the same type.  If you have any questions, please call [phone] [facility]

## 2015-09-16 NOTE — Progress Notes (Signed)
Safety precautions to be maintained throughout the outpatient stay will include: orient to surroundings, keep bed in low position, maintain call bell within reach at all times, provide assistance with transfer out of bed and ambulation. Methadone # 36/180  Filled 08-23-15

## 2015-09-22 LAB — TOXASSURE SELECT 13 (MW), URINE

## 2015-10-19 ENCOUNTER — Encounter: Payer: Medicare Other | Admitting: Pain Medicine

## 2015-11-02 ENCOUNTER — Encounter: Payer: Medicare Other | Admitting: Pain Medicine

## 2015-11-18 ENCOUNTER — Encounter: Payer: Self-pay | Admitting: Pain Medicine

## 2015-11-18 ENCOUNTER — Ambulatory Visit: Payer: Medicare Other | Attending: Pain Medicine | Admitting: Pain Medicine

## 2015-11-18 VITALS — BP 155/73 | HR 56 | Temp 98.2°F | Resp 16 | Ht 70.0 in | Wt 200.0 lb

## 2015-11-18 DIAGNOSIS — Z86010 Personal history of colonic polyps: Secondary | ICD-10-CM | POA: Diagnosis not present

## 2015-11-18 DIAGNOSIS — E559 Vitamin D deficiency, unspecified: Secondary | ICD-10-CM | POA: Diagnosis not present

## 2015-11-18 DIAGNOSIS — E782 Mixed hyperlipidemia: Secondary | ICD-10-CM | POA: Insufficient documentation

## 2015-11-18 DIAGNOSIS — R51 Headache: Secondary | ICD-10-CM | POA: Diagnosis not present

## 2015-11-18 DIAGNOSIS — G5600 Carpal tunnel syndrome, unspecified upper limb: Secondary | ICD-10-CM | POA: Insufficient documentation

## 2015-11-18 DIAGNOSIS — M542 Cervicalgia: Secondary | ICD-10-CM | POA: Diagnosis present

## 2015-11-18 DIAGNOSIS — G8929 Other chronic pain: Secondary | ICD-10-CM | POA: Diagnosis not present

## 2015-11-18 DIAGNOSIS — Z79891 Long term (current) use of opiate analgesic: Secondary | ICD-10-CM | POA: Insufficient documentation

## 2015-11-18 DIAGNOSIS — M961 Postlaminectomy syndrome, not elsewhere classified: Secondary | ICD-10-CM

## 2015-11-18 DIAGNOSIS — Z87891 Personal history of nicotine dependence: Secondary | ICD-10-CM | POA: Diagnosis not present

## 2015-11-18 DIAGNOSIS — F1190 Opioid use, unspecified, uncomplicated: Secondary | ICD-10-CM | POA: Diagnosis not present

## 2015-11-18 DIAGNOSIS — M7918 Myalgia, other site: Secondary | ICD-10-CM

## 2015-11-18 DIAGNOSIS — M79641 Pain in right hand: Secondary | ICD-10-CM | POA: Diagnosis present

## 2015-11-18 DIAGNOSIS — M792 Neuralgia and neuritis, unspecified: Secondary | ICD-10-CM

## 2015-11-18 DIAGNOSIS — M791 Myalgia: Secondary | ICD-10-CM | POA: Diagnosis not present

## 2015-11-18 DIAGNOSIS — M79642 Pain in left hand: Secondary | ICD-10-CM | POA: Diagnosis present

## 2015-11-18 DIAGNOSIS — Z7189 Other specified counseling: Secondary | ICD-10-CM | POA: Diagnosis not present

## 2015-11-18 DIAGNOSIS — F1290 Cannabis use, unspecified, uncomplicated: Secondary | ICD-10-CM | POA: Insufficient documentation

## 2015-11-18 DIAGNOSIS — M79673 Pain in unspecified foot: Secondary | ICD-10-CM | POA: Insufficient documentation

## 2015-11-18 DIAGNOSIS — M4802 Spinal stenosis, cervical region: Secondary | ICD-10-CM | POA: Insufficient documentation

## 2015-11-18 DIAGNOSIS — I10 Essential (primary) hypertension: Secondary | ICD-10-CM | POA: Insufficient documentation

## 2015-11-18 DIAGNOSIS — T402X5A Adverse effect of other opioids, initial encounter: Secondary | ICD-10-CM

## 2015-11-18 DIAGNOSIS — K5903 Drug induced constipation: Secondary | ICD-10-CM | POA: Diagnosis not present

## 2015-11-18 MED ORDER — METHADONE HCL 10 MG PO TABS: 20.0000 mg | ORAL_TABLET | Freq: Three times a day (TID) | ORAL | 0 refills | Status: DC

## 2015-11-18 MED ORDER — LUBIPROSTONE 24 MCG PO CAPS: 24.0000 ug | ORAL_CAPSULE | Freq: Two times a day (BID) | ORAL | 1 refills | Status: DC

## 2015-11-18 MED ORDER — ROPIVACAINE HCL 2 MG/ML IJ SOLN
INTRAMUSCULAR | Status: AC
  Administered 2015-11-18: 10:00:00
  Filled 2015-11-18: qty 10

## 2015-11-18 MED ORDER — ROPIVACAINE HCL 2 MG/ML IJ SOLN: 10.0000 mL | Freq: Once | INTRAMUSCULAR | Status: DC

## 2015-11-18 MED ORDER — PREGABALIN 150 MG PO CAPS: 150.0000 mg | ORAL_CAPSULE | Freq: Two times a day (BID) | ORAL | 1 refills | Status: DC

## 2015-11-18 MED ORDER — TRIAMCINOLONE ACETONIDE 40 MG/ML IJ SUSP: 40.0000 mg | Freq: Once | INTRAMUSCULAR | Status: DC

## 2015-11-18 MED ORDER — TRIAMCINOLONE ACETONIDE 40 MG/ML IJ SUSP
INTRAMUSCULAR | Status: AC
  Administered 2015-11-18: 10:00:00
  Filled 2015-11-18: qty 1

## 2015-11-18 MED ORDER — BENEFIBER PO POWD: ORAL | 1 refills | Status: AC

## 2015-11-18 NOTE — Progress Notes (Addendum)
Patient's Name: Craig Alvarado  Patient type: Established  MRN: [id number]  Service setting: Ambulatory outpatient  DOB: 1946/03/26  Location: [facility]  DOS: 11/18/2015  Primary Care Physician: Fritzi Mandes, MD  Note by: Kathlen Brunswick Dossie Arbour, M.D, DABA, DABAPM, DABPM, DABIPP, FIPP  Referring Physician: Fritzi Mandes, MD  Specialty: Board-Certified Interventional Pain Management  Last Visit to Pain Management: 09/16/2015   Primary Reason(s) for Visit: Encounter for prescription drug management & post-procedure evaluation of chronic illness with mild to moderate exacerbation(Level of risk: moderate) CC: Neck Pain (right) and Hand Pain (bilateral- carapal tunnel)   HPI  [name] is a 70 y.o. year old, male patient, who returns today as an established patient. 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[place]); Encounter for therapeutic drug level monitoring; Encounter for chronic pain management; Opioid dependence ([place]); Opioid-induced constipation (OIC); Chronic cervical radicular pain (Right); 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 His primarily concern today is the Neck Pain (right) and Hand  Pain (bilateral- carapal tunnel)   Pain Assessment: Self-Reported Pain Score: 0-No pain (hurts mainly when he moves his neck.)             Reported level is compatible with observation       Pain Type: Chronic pain Pain Location: Neck Pain Orientation: Right Pain Descriptors / Indicators: Aching, Constant Pain Frequency: Constant  The patient comes into the clinics today for post-procedure evaluation on the interventional treatment done on 09/16/2015. In addition, he comes in today for pharmacological management of his chronic pain.  The patient  reports that he does not use drugs. The patient was informed today that we will be starting to work on decreasing his opioid narcotics on his next visit.  Date of Last Visit: 09/16/15 Service Provided on Last Visit: Med Refill   Procedure:  Anesthesia, Analgesia, Anxiolysis:  Type: Therapeutic Trigger Point Injection Region: Posterolateral Shoulder area Level: Trapezius muscle   Laterality: Right-Sided    Indications: 1. Chronic pain   2. Long term current use of opiate analgesic   3. Opiate use (600 MME/Day)   4. Encounter for chronic pain management   5. 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 Therapeutic opioid-induced constipation (OIC)   7. Neurogenic pain   8. Myofascial pain     Pre-procedure Pain Score: 2/10 Reported level of pain is compatible with clinical observations Post-procedure Pain Score: 0-No pain (hurts mainly when he moves his neck.)  Type: Local Anesthesia Local Anesthetic(s): Lidocaine 1% Route: Subcutaneous IV Access: None. Sedation: Declined. Indication(s):Patient declined.   Pre-Procedure Assessment:  [name] is a 70 y.o. year old, male patient, seen today for interventional treatment. 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[place]); Encounter for therapeutic drug level monitoring; Encounter for chronic pain management; Opioid dependence ([place]); Opioid-induced constipation (OIC); Chronic cervical radicular pain (Right); 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 His primarily concern today is the Neck Pain (right) and Hand Pain (bilateral- carapal tunnel)   Pain Type: Chronic pain Pain Location: Neck Pain Orientation: Right Pain Descriptors / Indicators: Aching, Constant Pain Frequency: Constant  Date of Last Visit: 09/16/15 Service Provided on Last Visit: Med Refill  Coagulation Parameters No results found for: INR, LABPROT, APTT, PLT  Verification of the correct person, correct site (including marking of site), and correct procedure were performed and confirmed by the patient.  Consent: Secured. Under the influence of no sedatives a written informed consent was obtained, after having provided information on the risks and possible complications. To fulfill our ethical and legal obligations, as recommended by the American Medical Association's Code of Ethics, we have provided information to the patient about our clinical impression; the nature and purpose of the treatment or procedure; the risks, benefits, and possible complications of the intervention; alternatives; the risk(s) and  benefit(s) of the alternative treatment(s) or procedure(s); and the risk(s) and benefit(s) of doing nothing. The patient was provided information about the risks and possible complications associated with the procedure. These include, but are not limited to, failure to achieve desired goals, infection, bleeding, organ or nerve damage, allergic reactions, paralysis, and death. In addition, the patient was informed that Medicine is not an exact science; therefore, there is also the possibility of unforeseen risks and possible complications that may result in a catastrophic outcome. The patient indicated having understood very clearly. We have given the patient no guarantees and we have made no promises. Enough time was given to the patient to ask questions, all of which were answered to the patient's satisfaction.  Consent Attestation: I, the ordering provider, attest that I have discussed with the patient the benefits, risks, side-effects, alternatives, likelihood of achieving goals, and potential problems during recovery for the procedure that I have provided informed consent.  Pre-Procedure Preparation: Safety Precautions: Allergies reviewed. Appropriate site, procedure, and patient were confirmed by following the Joint Commission's Universal Protocol (UP.01.01.01), in the form of a "Time Out". The patient was asked to confirm marked site and procedure, before commencing. The patient was asked about blood thinners, or active infections, both of which were denied. Patient was assessed for positional comfort and all pressure points were checked before starting procedure. Allergies: He has No Known Allergies.. Infection Control Precautions: Sterile technique used. Standard Universal Precautions were taken as recommended by the Department of [facility] for Disease Control and Prevention (CDC). Standard pre-surgical skin prep was conducted. Respiratory hygiene and cough etiquette was practiced. Hand  hygiene observed. Safe injection practices and needle disposal techniques followed. SDV (single dose vial) medications used. Medications properly checked for expiration dates and contaminants. Personal protective equipment (PPE) used: Sterile surgical gloves. Monitoring:  As per clinic protocol. Vitals:   11/18/15 0841 11/18/15  0947  BP: 123/67 (!) 155/73  Pulse: (!) 43 (!) 56  Resp: 16 16  Temp: 98.2 F (36.8 C)   TempSrc: Oral   SpO2: 99%   Weight: 200 lb (90.7 kg)   Height: 5\' 10"  (1.778 m)   Calculated BMI: Body mass index is 28.7 kg/m.  Description of Procedure Process:   Time-out: "Time-out" completed before starting procedure, as per protocol. Position: Sitting Target Area: Trigger Point Approach: Ipsilateral approach. Area Prepped: Entire Shoulder   Region Prepping solution: ChloraPrep (2% chlorhexidine gluconate and 70% isopropyl alcohol) Safety Precautions: Aspiration looking for blood return was conducted prior to all injections. At no point did we inject any substances, as a needle was being advanced. No attempts were made at seeking any paresthesias. Safe injection practices and needle disposal techniques used. Medications properly checked for expiration dates. SDV (single dose vial) medications used.   Description of the Procedure: Protocol guidelines were followed. The patient was placed in position over the fluoroscopy table. The target area was identified and the area prepped in the usual manner. Skin desensitized using vapocoolant spray. Skin & deeper tissues infiltrated with local anesthetic. Appropriate amount of time allowed to pass for local anesthetics to take effect. The procedure needles were then advanced to the target area. Proper needle placement secured. Negative aspiration confirmed. Solution injected in intermittent fashion, asking for systemic symptoms every 0.5cc of injectate. The needles were then removed and the area cleansed, making sure to leave some of  the prepping solution back to take advantage of its long term bactericidal properties. EBL: None Materials & Medications Used:  Needle(s) Used: 25g - 1.5" Needle(s) Medications Administered today: We administered ropivacaine (PF) 2 mg/ml (0.2%) and triamcinolone acetonide.Please see chart orders for dosing details.  Imaging Guidance:  Type of Imaging Technique: None Indication(s): N/A  Antibiotics:   Type:  Antibiotics Given (last 72 hours)    None       Post-operative Assessment:   Complications: No immediate post-procedural complications were observed. Disposition: Return to clinic for follow-up evaluation. The patient tolerated the entire procedure well. The patient was provided with post-procedure discharge instructions, including a section on how to identify potential problems. Should any problems arise concerning this procedure, the patient was given instructions to immediately contact us, at any time, without hesitation. In any case, we plan to contact the patient by telephone for a follow-up status report regarding this interventional procedure. Comments:  No additional relevant information.  Controlled Substance Pharmacotherapy Assessment & REMS (Risk Evaluation and Mitigation Strategy)  Analgesic: Methadone 10 mg 2 tablets every 8 hours (60 mg/day) MME/day: 600 mg/day  Pill Count: PILLS REMAINING  25/180 Methadone 10mg   Filled 10/23/15,  19/60 lyrica 150mg  Filled 10/23/15. Pharmacokinetics: Onset of action (Liberation/Absorption): Within expected pharmacological parameters Time to Peak effect (Distribution): Timing and results are as within normal expected parameters Duration of action (Metabolism/Excretion): Within normal limits for medication Pharmacodynamics: Analgesic Effect: More than 50% Activity Facilitation: Medication(s) allow patient to sit, stand, walk, and do the basic ADLs Perceived Effectiveness: Described as relatively effective, allowing for increase in  activities of daily living (ADL) Side-effects or Adverse reactions: None reported Monitoring: [place] PMP: Online review of the past 48-month period conducted. Compliant with practice rules and regulations Last UDS on record: ToxAssure Select 13  Date Value Ref Range Status  09/16/2015 FINAL  Final    Comment:    ==================================================================== TOXASSURE SELECT 13 (MW) ==================================================================== Test  Result       Flag       Units Drug Present and Declared for Prescription Verification   Methadone                      2248         EXPECTED   ng/mg creat   EDDP (Methadone Mtb)           4685         EXPECTED   ng/mg creat    Sources of methadone include scheduled prescription medications.    EDDP is an expected metabolite of methadone. ==================================================================== Test                      Result    Flag   Units      Ref Range   Creatinine              149              mg/dL      >=20 ==================================================================== Declared Medications:  The flagging and interpretation on this report are based on the  following declared medications.  Unexpected results may arise from  inaccuracies in the declared medications.  **Note: The testing scope of this panel includes these medications:  Methadone  **Note: The testing scope of this panel does not include following  reported medications:  Aspirin  Atorvastatin  Cholecalciferol  Docusate  Finasteride  Lubiprostone  Magnesium Oxide  Naloxone  Naproxen  Pregabalin  Promethazine  Sumatriptan  Supplement  Tamsulosin ==================================================================== For clinical consultation, please call [phone]. ====================================================================    UDS interpretation: Compliant          Medication  Assessment Form: Reviewed. Patient indicates being compliant with therapy Treatment compliance: Compliant Risk Assessment: Aberrant Behavior: None observed today Substance Use Disorder (SUD) Risk Level: Low Risk of opioid abuse or dependence: 0.7-3.0% with doses ? 36 MME/day and 6.1-26% with doses ? 120 MME/day. Opioid Risk Tool (ORT) Score: Total Score: 3 Low Risk for SUD (Score <3) Depression Scale Score: PHQ-2: PHQ-2 Total Score: 0 No depression (0) PHQ-9: PHQ-9 Total Score: 0 No depression (0-4)  Pharmacologic Plan: No change in therapy, at this time  Laboratory Chemistry  Inflammation Markers Lab Results  Component Value Date   ESRSEDRATE 11 03/29/2015   CRP 0.5 03/29/2015    Renal Function Lab Results  Component Value Date   BUN 24 (H) 03/29/2015   CREATININE 0.89 03/29/2015   GFRAA >60 03/29/2015   GFRNONAA >60 03/29/2015    Hepatic Function Lab Results  Component Value Date   AST 26 03/29/2015   ALT 16 (L) 03/29/2015   ALBUMIN 3.9 03/29/2015    Electrolytes Lab Results  Component Value Date   NA 141 03/29/2015   K 4.6 03/29/2015   CL 107 03/29/2015   CALCIUM 9.0 03/29/2015   MG 2.2 03/29/2015    Pain Modulating Vitamins Lab Results  Component Value Date   25OHVITD1 24 (L) 03/29/2015   25OHVITD2 <1.0 03/29/2015   25OHVITD3 24 03/29/2015    Coagulation Parameters No results found for: INR, LABPROT, APTT, PLT  Cardiovascular No results found for: BNP, HGB, HCT  Note: Lab results reviewed.  Recent Diagnostic Imaging  No results found.  Meds  The patient has a current medication list which includes the following prescription(s): aspirin, atorvastatin, vitamin d3, docusate sodium, finasteride, magnesium oxide, promethazine, sumatriptan, tamsulosin, lubiprostone, methadone, methadone, naloxone hcl, pregabalin, and benefiber,  and the following Facility-Administered Medications: ropivacaine (pf) 2 mg/ml (0.2%) and triamcinolone acetonide.  Current  Outpatient Prescriptions on File Prior to Visit  Medication Sig  . aspirin 81 MG tablet Take 81 mg by mouth daily.  Marland Kitchen atorvastatin (LIPITOR) 40 MG tablet Take 40 mg by mouth.  . Cholecalciferol (VITAMIN D3) 2000 units capsule Take 1 capsule (2,000 Units total) by mouth daily.  Marland Kitchen docusate sodium (STOOL SOFTENER) 100 MG capsule Take by mouth.  . finasteride (PROSCAR) 5 MG tablet Take 5 mg by mouth daily.  . magnesium oxide (MAG-OX) 400 MG tablet Take 1 tablet (400 mg total) by mouth daily.  . promethazine (PHENERGAN) 25 MG tablet Take 25 mg by mouth every 6 (six) hours as needed for nausea or vomiting.  . SUMAtriptan (IMITREX) 50 MG tablet Take by mouth as needed.   . tamsulosin (FLOMAX) 0.4 MG CAPS capsule Take 0.4 mg by mouth daily.  . Naloxone HCl (NARCAN) 4 MG/0.1ML LIQD Place 1 spray into the nose once. Spray half of bottle content into each nostril, then call 911   No current facility-administered medications on file prior to visit.     ROS  Constitutional: Denies any fever or chills Gastrointestinal: No reported hemesis, hematochezia, vomiting, or acute GI distress Musculoskeletal: Denies any acute onset joint swelling, redness, loss of ROM, or weakness Neurological: No reported episodes of acute onset apraxia, aphasia, dysarthria, agnosia, amnesia, paralysis, loss of coordination, or loss of consciousness  Allergies  Mr. Bartels has No Known Allergies.  [place]  Medical:  Mr. 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 Family: family  history includes Alcohol abuse in his father; Alzheimer's disease in his mother; Cancer in his father. Surgical:  has a past surgical history that includes Spine surgery. Tobacco:  reports that he quit smoking about 11 months ago. He has never used smokeless tobacco. Alcohol:  reports that he drinks alcohol. Drug:  reports that he does not use drugs.  Constitutional Exam  Vitals: Blood pressure (!) 155/73, pulse (!) 56, temperature 98.2 F (36.8 C), temperature source Oral, resp. rate 16, height 5\' 10"  (1.778 m), weight 200 lb (90.7 kg), SpO2 99 %. General appearance: Well nourished, well developed, and well hydrated. In no acute distress Calculated BMI/Body habitus: Body mass index is 28.7 kg/m. (25-29.9 kg/m2) Overweight - 20% higher incidence of chronic pain Psych/Mental status: Alert and oriented x 3 (person, place, & time) Eyes: PERLA Respiratory: No evidence of acute respiratory distress  Cervical Spine Exam  Inspection: No masses, redness, or swelling Alignment: Symmetrical ROM: Functional: ROM is within functional limits [facility]) Stability: No instability detected Muscle strength & Tone: Functionally intact Sensory: Unimpaired Palpation: Palpable trigger point over the right trapezius muscle. The trigger point was injected today.  Upper Extremity (UE) Exam    Side: Right upper extremity  Side: Left upper extremity  Inspection: No masses, redness, swelling, or asymmetry  Inspection: No masses, redness, swelling, or asymmetry  ROM:  ROM:  Functional: ROM is within functional limits [facility])        Functional: ROM is within functional limits [facility])        Muscle strength & Tone: Functionally intact  Muscle strength & Tone: Functionally intact  Sensory: Unimpaired  Sensory: Unimpaired  Palpation: No complaints of tenderness  Palpation: No complaints of tenderness   Thoracic Spine Exam  Inspection: No masses, redness, or swelling Alignment: Symmetrical ROM: Functional: ROM is  within functional limits [facility]) Stability: No instability detected Sensory: Unimpaired Muscle strength & Tone: Functionally intact Palpation: No complaints of tenderness  Lumbar Spine Exam  Inspection: No masses, redness, or swelling Alignment: Symmetrical ROM: Functional: ROM is within functional limits [facility]) Stability: No instability detected Muscle strength & Tone: Functionally intact Sensory: Unimpaired Palpation: No complaints of tenderness Provocative Tests: Lumbar Hyperextension and rotation test: provocative test deferred today       Patrick's Maneuver: provocative test deferred today              Gait & Posture Assessment  Ambulation: Unassisted Gait: Unaffected Posture: WNL   Lower Extremity Exam    Side: Right lower extremity  Side: Left lower extremity  Inspection: No masses, redness, swelling, or asymmetry ROM:  Inspection: No masses, redness, swelling, or asymmetry ROM:  Functional: ROM is within functional limits [facility])        Functional: ROM is within functional limits [facility])        Muscle strength & Tone: Functionally intact  Muscle strength & Tone: Functionally intact  Sensory: Unimpaired  Sensory: Unimpaired  Palpation: No complaints of tenderness  Palpation: No complaints of tenderness   Assessment & Plan  Primary Diagnosis & Pertinent Problem List: The primary encounter diagnosis was Chronic pain. 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  Visit Diagnosis: 1. Chronic pain   2. Long term current use of opiate analgesic   3. Opiate use (600 MME/Day)   4. Encounter for chronic pain management   5. 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 Therapeutic  opioid-induced constipation (OIC)   7. Neurogenic pain   8. Myofascial pain     Problems updated and reviewed during this visit: No problems updated.  Problem-specific Plan(s): No problem-specific Assessment & Plan notes found for this encounter.  No new Assessment & Plan notes have been filed under this hospital service since the last note was generated. Service: Pain Management   Plan of Care   Problem List Items Addressed This Visit      High   Chronic pain - Primary (Chronic)   Relevant Medications   pregabalin (LYRICA) 150 MG capsule   methadone (DOLOPHINE) 10 MG tablet   methadone (DOLOPHINE) 10 MG tablet   triamcinolone acetonide (KENALOG-40) injection 40 mg   ropivacaine (PF) 2 mg/ml (0.2%) (NAROPIN) epidural 10 mL   ropivacaine (PF) 2 mg/ml (0.2%) (NAROPIN) 2 MG/ML epidural (Completed)   triamcinolone acetonide (KENALOG-40) 40 MG/ML injection (Completed)   Other Relevant Orders   Comprehensive metabolic panel   C-reactive protein   Magnesium   Sedimentation rate   Vitamin B12   25-Hydroxyvitamin D Lcms D2+D3   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) (Chronic)   Myofascial pain (Chronic)   Relevant Medications   triamcinolone acetonide (KENALOG-40) injection 40 mg   ropivacaine (PF) 2 mg/ml (0.2%) (NAROPIN) epidural 10 mL   Other Relevant Orders   TRIGGER POINT INJECTION   Neurogenic pain (Chronic)   Relevant Medications   pregabalin (LYRICA)  150 MG capsule     Medium   Encounter for chronic pain management   Long term current use of opiate analgesic (Chronic)   Relevant Orders   ToxASSURE Select 13 (MW), Urine   Opiate use (600 MME/Day) (Chronic)   Opioid-induced constipation (OIC) (Chronic)   Relevant Medications   Wheat Dextrin (BENEFIBER) POWD   lubiprostone (AMITIZA) 24 MCG capsule    Other Visit Diagnoses   None.      Pharmacotherapy (Medications Ordered): Meds ordered this encounter  Medications  . Wheat  Dextrin (BENEFIBER) POWD    Sig: Stir 2 tsp. TID into 4-8 oz of any non-carbonated beverage or soft food (hot or cold)    Dispense:  500 g    Refill:  1    Do not place this medication, or any other prescription from our practice, on "Automatic Refill". Patient may have prescription filled one day early if pharmacy is closed on scheduled refill date.  . lubiprostone (AMITIZA) 24 MCG capsule    Sig: Take 1 capsule (24 mcg total) by mouth 2 (two) times daily with a meal. Swallow the medication whole. Do not break or chew the medication.    Dispense:  60 capsule    Refill:  1    Do not place this medication, or any other prescription from our practice, on "Automatic Refill". Patient may have prescription filled one day early if pharmacy is closed on scheduled refill date.  . pregabalin (LYRICA) 150 MG capsule    Sig: Take 1 capsule (150 mg total) by mouth 2 (two) times daily.    Dispense:  60 capsule    Refill:  1    Do not place this medication, or any other prescription from our practice, on "Automatic Refill". Patient may have prescription filled one day early if pharmacy is closed on scheduled refill date.  . methadone (DOLOPHINE) 10 MG tablet    Sig: Take 2 tablets (20 mg total) by mouth every 8 (eight) hours.    Dispense:  180 tablet    Refill:  0    Do not place this medication, or any other prescription from our practice, on "Automatic Refill". Patient may have prescription filled one day early if pharmacy is closed on scheduled refill date. Do not fill until: 11/22/15 To last until: 12/22/15  . methadone (DOLOPHINE) 10 MG tablet    Sig: Take 2 tablets (20 mg total) by mouth every 8 (eight) hours.    Dispense:  180 tablet    Refill:  0    Do not place this medication, or any other prescription from our practice, on "Automatic Refill". Patient may have prescription filled one day early if pharmacy is closed on scheduled refill date. Do not fill until: 12/22/15 To last until: 01/21/16    . triamcinolone acetonide (KENALOG-40) injection 40 mg  . ropivacaine (PF) 2 mg/ml (0.2%) (NAROPIN) epidural 10 mL  . ropivacaine (PF) 2 mg/ml (0.2%) (NAROPIN) 2 MG/ML epidural    Sharlett Iles, Delores: cabinet override  . triamcinolone acetonide (KENALOG-40) 40 MG/ML injection    Sharlett Iles, Delores: cabinet override    [employer] & Procedure Ordered: Orders Placed This Encounter  Procedures  . TRIGGER POINT INJECTION  . ToxASSURE Select 13 (MW), Urine  . Comprehensive metabolic panel  . C-reactive protein  . Magnesium  . Sedimentation rate  . Vitamin B12  . 25-Hydroxyvitamin D Lcms D2+D3    Imaging Ordered: None  Interventional Therapies: Scheduled:  None at this point.    Considering:  Cervical epidural steroid injection under fluoroscopic guidance, with or without sedation.    PRN Procedures:  Right sided cervical epidural steroid injection under fluoroscopic guidance, without without sedation.    Referral(s) or Consult(s): None at this time.  New Prescriptions   No medications on file    Medications administered during this visit: We administered ropivacaine (PF) 2 mg/ml (0.2%) and triamcinolone acetonide.  Requested PM Follow-up: Return in 8 weeks (on 01/10/2016) for Med-Mgmt, (2-Mo).  Future Appointments Date Time Provider [employer]  01/17/2016 1:40 PM Milinda Pointer, MD [facility] None    Primary Care Physician: Fritzi Mandes, MD Location: [facility] Facility Note by: Kathlen Brunswick Dossie Arbour, M.D, DABA, DABAPM, DABPM, DABIPP, FIPP  Pain Score Disclaimer: We use the NRS-11 scale. This is a self-reported, subjective measurement of pain severity with only modest accuracy. It is used primarily to identify changes within a particular patient. It must be understood that outpatient pain scales are significantly less accurate that those used for research, where they can be applied under ideal controlled circumstances with minimal  exposure to variables. In reality, the score is likely to be a combination of pain intensity and pain affect, where pain affect describes the degree of emotional arousal or changes in action readiness caused by the sensory experience of pain. Factors such as social and work situation, setting, emotional state, anxiety levels, expectation, and prior pain experience may influence pain perception and show large inter-individual differences that may also be affected by time variables.  Patient instructions provided at this appointment:: Patient Instructions  Trigger Point Injection Trigger points are areas where you have muscle pain. A trigger point injection is a shot given in the trigger point to relieve that pain. A trigger point might feel like a knot in your muscle. It hurts to press on a trigger point. Sometimes the pain spreads out (radiates) to other parts of the body. For example, pressing on a trigger point in your shoulder might cause pain in your arm or neck. You might have one trigger point. Or, you might have more than one. People often have trigger points in their upper back and lower back. They also occur often in the neck and shoulders. Pain from a trigger point lasts for a long time. It can make it hard to keep moving. You might not be able to do the exercise or physical therapy that could help you deal with the pain. A trigger point injection may help. It does not work for everyone. But, it may relieve your pain for a few days or a few months. A trigger point injection does not cure long-lasting (chronic) pain. LET YOUR CAREGIVER KNOW ABOUT:  Any allergies (especially to latex, lidocaine, or steroids).  Blood-thinning medicines that you take. These drugs can lead to bleeding or bruising after an injection. They include:  Aspirin.  Ibuprofen.  Clopidogrel.  Warfarin.  Other medicines you take. This includes all vitamins, herbs, eyedrops, over-the-counter medicines, and  creams.  Use of steroids.  Recent infections.  Past problems with numbing medicines.  Bleeding problems.  Surgeries you have had.  Other health problems. RISKS AND COMPLICATIONS A trigger point injection is a safe treatment. However, problems may develop, such as:  Minor side effects usually go away in 1 to 2 days. These may include:  Soreness.  Bruising.  Stiffness.  More serious problems are rare. But, they may include:  Bleeding under the skin (hematoma).  Skin infection.  Breaking off of the needle under  your skin.  Lung puncture.  The trigger point injection may not work for you. BEFORE THE PROCEDURE You may need to stop taking any medicine that thins your blood. This is to prevent bleeding and bruising. Usually these medicines are stopped several days before the injection. No other preparation is needed. PROCEDURE  A trigger point injection can be given in your caregiver's office or in a clinic. Each injection takes 2 minutes or less.  Your caregiver will feel for trigger points. The caregiver may use a marker to circle the area for the injection.  The skin over the trigger point will be washed with a germ-killing (antiseptic) solution.  The caregiver pinches the spot for the injection.  Then, a very thin needle is used for the shot. You may feel pain or a twitching feeling when the needle enters the trigger point.  A numbing solution may be injected into the trigger point. Sometimes a drug to keep down swelling, redness, and warmth (inflammation) is also injected.  Your caregiver moves the needle around the trigger zone until the tightness and twitching goes away.  After the injection, your caregiver may put gentle pressure over the injection site.  Then it is covered with a bandage. AFTER THE PROCEDURE  You can go right home after the injection.  The bandage can be taken off after a few hours.  You may feel sore and stiff for 1 to 2 days.  Go  back to your regular activities slowly. Your caregiver may ask you to stretch your muscles. Do not do anything that takes extra energy for a few days.  Follow your caregiver's instructions to manage and treat other pain.   This information is not intended to replace advice given to you by your health care provider. Make sure you discuss any questions you have with your health care provider.   Document Released: 03/30/2011 Document Revised: 08/05/2012 Document Reviewed: 03/30/2011 Elsevier Interactive Patient Education 2016 [employer] Pain Management Discharge Instructions  General Discharge Instructions :  If you need to reach your doctor call: Monday-Friday 8:00 am - 4:00 pm at [phone] or toll free [phone].  After clinic hours [phone] to have operator reach doctor.  Bring all of your medication bottles to all your appointments in the pain clinic.  To cancel or reschedule your appointment with Pain Management please remember to call 24 hours in advance to avoid a fee.  Refer to the educational materials which you have been given on: General Risks, I had my Procedure. Discharge Instructions, Post Sedation.  Post Procedure Instructions:  The drugs you were given will stay in your system until tomorrow, so for the next 24 hours you should not drive, make any legal decisions or drink any alcoholic beverages.  You may eat anything you prefer, but it is better to start with liquids then soups and crackers, and gradually work up to solid foods.  Please notify your doctor immediately if you have any unusual bleeding, trouble breathing or pain that is not related to your normal pain.  Depending on the type of procedure that was done, some parts of your body may feel week and/or numb.  This usually clears up by tonight or the next day.  Walk with the use of an assistive device or accompanied by an adult for the 24 hours.  You may use ice on the affected area for the first  24 hours.  Put ice in a Ziploc bag and cover with a towel and place against  area 15 minutes on 15 minutes off.  You may switch to heat after 24 hours.

## 2015-11-18 NOTE — Progress Notes (Signed)
Safety precautions to be maintained throughout the outpatient stay will include: orient to surroundings, keep bed in low position, maintain call bell within reach at all times, provide assistance with transfer out of bed and ambulation.  PILLS REMAINING  25/180 Methadone 10mg   Filled 10/23/15,  19/60 lyrica 150mg  Filled 10/23/15

## 2015-11-18 NOTE — Patient Instructions (Signed)
Trigger Point Injection Trigger points are areas where you have muscle pain. A trigger point injection is a shot given in the trigger point to relieve that pain. A trigger point might feel like a knot in your muscle. It hurts to press on a trigger point. Sometimes the pain spreads out (radiates) to other parts of the body. For example, pressing on a trigger point in your shoulder might cause pain in your arm or neck. You might have one trigger point. Or, you might have more than one. People often have trigger points in their upper back and lower back. They also occur often in the neck and shoulders. Pain from a trigger point lasts for a long time. It can make it hard to keep moving. You might not be able to do the exercise or physical therapy that could help you deal with the pain. A trigger point injection may help. It does not work for everyone. But, it may relieve your pain for a few days or a few months. A trigger point injection does not cure long-lasting (chronic) pain. LET YOUR CAREGIVER KNOW ABOUT:  Any allergies (especially to latex, lidocaine, or steroids).  Blood-thinning medicines that you take. These drugs can lead to bleeding or bruising after an injection. They include:  Aspirin.  Ibuprofen.  Clopidogrel.  Warfarin.  Other medicines you take. This includes all vitamins, herbs, eyedrops, over-the-counter medicines, and creams.  Use of steroids.  Recent infections.  Past problems with numbing medicines.  Bleeding problems.  Surgeries you have had.  Other health problems. RISKS AND COMPLICATIONS A trigger point injection is a safe treatment. However, problems may develop, such as:  Minor side effects usually go away in 1 to 2 days. These may include:  Soreness.  Bruising.  Stiffness.  More serious problems are rare. But, they may include:  Bleeding under the skin (hematoma).  Skin infection.  Breaking off of the needle under your skin.  Lung  puncture.  The trigger point injection may not work for you. BEFORE THE PROCEDURE You may need to stop taking any medicine that thins your blood. This is to prevent bleeding and bruising. Usually these medicines are stopped several days before the injection. No other preparation is needed. PROCEDURE  A trigger point injection can be given in your caregiver's office or in a clinic. Each injection takes 2 minutes or less.  Your caregiver will feel for trigger points. The caregiver may use a marker to circle the area for the injection.  The skin over the trigger point will be washed with a germ-killing (antiseptic) solution.  The caregiver pinches the spot for the injection.  Then, a very thin needle is used for the shot. You may feel pain or a twitching feeling when the needle enters the trigger point.  A numbing solution may be injected into the trigger point. Sometimes a drug to keep down swelling, redness, and warmth (inflammation) is also injected.  Your caregiver moves the needle around the trigger zone until the tightness and twitching goes away.  After the injection, your caregiver may put gentle pressure over the injection site.  Then it is covered with a bandage. AFTER THE PROCEDURE  You can go right home after the injection.  The bandage can be taken off after a few hours.  You may feel sore and stiff for 1 to 2 days.  Go back to your regular activities slowly. Your caregiver may ask you to stretch your muscles. Do not do anything that takes   extra energy for a few days.  Follow your caregiver's instructions to manage and treat other pain.   This information is not intended to replace advice given to you by your health care provider. Make sure you discuss any questions you have with your health care provider.   Document Released: 03/30/2011 Document Revised: 08/05/2012 Document Reviewed: 03/30/2011 Elsevier Interactive Patient Education 2016 [employer] Pain  Management Discharge Instructions  General Discharge Instructions :  If you need to reach your doctor call: Monday-Friday 8:00 am - 4:00 pm at [phone] or toll free [phone].  After clinic hours [phone] to have operator reach doctor.  Bring all of your medication bottles to all your appointments in the pain clinic.  To cancel or reschedule your appointment with Pain Management please remember to call 24 hours in advance to avoid a fee.  Refer to the educational materials which you have been given on: General Risks, I had my Procedure. Discharge Instructions, Post Sedation.  Post Procedure Instructions:  The drugs you were given will stay in your system until tomorrow, so for the next 24 hours you should not drive, make any legal decisions or drink any alcoholic beverages.  You may eat anything you prefer, but it is better to start with liquids then soups and crackers, and gradually work up to solid foods.  Please notify your doctor immediately if you have any unusual bleeding, trouble breathing or pain that is not related to your normal pain.  Depending on the type of procedure that was done, some parts of your body may feel week and/or numb.  This usually clears up by tonight or the next day.  Walk with the use of an assistive device or accompanied by an adult for the 24 hours.  You may use ice on the affected area for the first 24 hours.  Put ice in a Ziploc bag and cover with a towel and place against area 15 minutes on 15 minutes off.  You may switch to heat after 24 hours.

## 2015-11-28 LAB — TOXASSURE SELECT 13 (MW), URINE: PDF: 0

## 2015-12-29 ENCOUNTER — Telehealth: Payer: Self-pay

## 2015-12-29 NOTE — Telephone Encounter (Signed)
Lyrica last written on 11-18-15, with 1 refill. Has appt on 01-17-16. Patient notified per voicemail that he should have enough to last until appt, , we cannot write any sooner.

## 2015-12-29 NOTE — Telephone Encounter (Signed)
[facility], they made an error on the prescription, there is one refill remaining. Patient notified per voicemail.

## 2015-12-29 NOTE — Telephone Encounter (Signed)
Patient called and said he was out of Lyrica and wants it to be called in to the [facility] in [place].

## 2015-12-29 NOTE — Telephone Encounter (Signed)
Patient called and states his bottle clearly states no refill. He needs a refill called it. He only one script for lyrica and no refill is circled on the bottle.

## 2016-01-17 ENCOUNTER — Ambulatory Visit: Payer: Medicare Other | Attending: Pain Medicine | Admitting: Pain Medicine

## 2016-01-17 ENCOUNTER — Encounter: Payer: Self-pay | Admitting: Pain Medicine

## 2016-01-17 VITALS — BP 143/73 | HR 62 | Temp 97.9°F | Resp 18 | Ht 70.0 in | Wt 200.0 lb

## 2016-01-17 DIAGNOSIS — M5412 Radiculopathy, cervical region: Secondary | ICD-10-CM | POA: Diagnosis not present

## 2016-01-17 DIAGNOSIS — Z79899 Other long term (current) drug therapy: Secondary | ICD-10-CM | POA: Diagnosis not present

## 2016-01-17 DIAGNOSIS — F1990 Other psychoactive substance use, unspecified, uncomplicated: Secondary | ICD-10-CM

## 2016-01-17 DIAGNOSIS — E7801 Familial hypercholesterolemia: Secondary | ICD-10-CM | POA: Insufficient documentation

## 2016-01-17 DIAGNOSIS — K5900 Constipation, unspecified: Secondary | ICD-10-CM | POA: Insufficient documentation

## 2016-01-17 DIAGNOSIS — G5600 Carpal tunnel syndrome, unspecified upper limb: Secondary | ICD-10-CM | POA: Insufficient documentation

## 2016-01-17 DIAGNOSIS — F1290 Cannabis use, unspecified, uncomplicated: Secondary | ICD-10-CM | POA: Diagnosis not present

## 2016-01-17 DIAGNOSIS — Z7982 Long term (current) use of aspirin: Secondary | ICD-10-CM | POA: Diagnosis not present

## 2016-01-17 DIAGNOSIS — Z87891 Personal history of nicotine dependence: Secondary | ICD-10-CM | POA: Insufficient documentation

## 2016-01-17 DIAGNOSIS — F1120 Opioid dependence, uncomplicated: Secondary | ICD-10-CM | POA: Diagnosis not present

## 2016-01-17 DIAGNOSIS — M79641 Pain in right hand: Secondary | ICD-10-CM | POA: Diagnosis present

## 2016-01-17 DIAGNOSIS — I10 Essential (primary) hypertension: Secondary | ICD-10-CM | POA: Diagnosis not present

## 2016-01-17 DIAGNOSIS — M25511 Pain in right shoulder: Secondary | ICD-10-CM | POA: Insufficient documentation

## 2016-01-17 DIAGNOSIS — G8929 Other chronic pain: Secondary | ICD-10-CM | POA: Diagnosis not present

## 2016-01-17 DIAGNOSIS — E559 Vitamin D deficiency, unspecified: Secondary | ICD-10-CM | POA: Insufficient documentation

## 2016-01-17 DIAGNOSIS — M79642 Pain in left hand: Secondary | ICD-10-CM | POA: Diagnosis present

## 2016-01-17 DIAGNOSIS — M792 Neuralgia and neuritis, unspecified: Secondary | ICD-10-CM

## 2016-01-17 DIAGNOSIS — G629 Polyneuropathy, unspecified: Secondary | ICD-10-CM | POA: Diagnosis not present

## 2016-01-17 DIAGNOSIS — K5903 Drug induced constipation: Secondary | ICD-10-CM

## 2016-01-17 DIAGNOSIS — F1190 Opioid use, unspecified, uncomplicated: Secondary | ICD-10-CM

## 2016-01-17 DIAGNOSIS — T402X5A Adverse effect of other opioids, initial encounter: Secondary | ICD-10-CM

## 2016-01-17 DIAGNOSIS — M542 Cervicalgia: Secondary | ICD-10-CM

## 2016-01-17 DIAGNOSIS — Z79891 Long term (current) use of opiate analgesic: Secondary | ICD-10-CM | POA: Insufficient documentation

## 2016-01-17 MED ORDER — PREGABALIN 150 MG PO CAPS: 150.0000 mg | ORAL_CAPSULE | Freq: Two times a day (BID) | ORAL | 1 refills | Status: DC

## 2016-01-17 MED ORDER — METHADONE HCL 5 MG PO TABS: 5.0000 mg | ORAL_TABLET | Freq: Two times a day (BID) | ORAL | 0 refills | Status: DC

## 2016-01-17 MED ORDER — METHADONE HCL 5 MG PO TABS: 10.0000 mg | ORAL_TABLET | Freq: Three times a day (TID) | ORAL | 0 refills | Status: DC

## 2016-01-17 MED ORDER — METHADONE HCL 5 MG PO TABS
5.0000 mg | ORAL_TABLET | Freq: Three times a day (TID) | ORAL | 0 refills | Status: DC
Start: 2016-02-04 — End: 2016-02-10

## 2016-01-17 MED ORDER — METHADONE HCL 5 MG PO TABS: 10.0000 mg | ORAL_TABLET | Freq: Two times a day (BID) | ORAL | 0 refills | Status: DC

## 2016-01-17 MED ORDER — METHADONE HCL 5 MG PO TABS: 5.0000 mg | ORAL_TABLET | Freq: Every day | ORAL | 0 refills | Status: DC

## 2016-01-17 MED ORDER — METHADONE HCL 10 MG PO TABS: 10.0000 mg | ORAL_TABLET | Freq: Three times a day (TID) | ORAL | 0 refills | Status: DC

## 2016-01-17 MED ORDER — LUBIPROSTONE 24 MCG PO CAPS: 24.0000 ug | ORAL_CAPSULE | Freq: Two times a day (BID) | ORAL | 1 refills | Status: DC

## 2016-01-17 NOTE — Progress Notes (Signed)
Patient's Name: Craig Alvarado  MRN: [id number]  Referring Provider: Fritzi Mandes, MD  DOB: [birth year]  PCP: Fritzi Mandes, MD  DOS: 01/17/2016  Note by: Kathlen Brunswick. Dossie Arbour, MD  Service setting: Ambulatory outpatient  Specialty: Interventional Pain Management  Location: [facility] (AMB) Pain Management Facility    Patient type: Established   Primary Reason(s) for Visit: Encounter for prescription drug management & post-procedure evaluation of chronic illness with mild to moderate exacerbation(Level of risk: moderate) CC: Shoulder Pain (right); Neck Pain (right); and Hand Pain (bilateral)  HPI  Mr. Basore is a 70 y.o. year old, male patient, who comes today for an initial evaluation. 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[place]); Encounter for therapeutic drug level monitoring; Encounter for chronic pain management; Opioid dependence ([place]); Opioid-induced constipation (OIC); Chronic cervical radicular pain (Right); 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 His primarily concern today is the Shoulder Pain (right); Neck Pain (right); and Hand Pain (bilateral)  Pain Assessment: Self-Reported Pain Score: 5 /10 Clinically the patient looks  like a 1/10 Reported level is inconsistent with clinical observations. Information on the proper use of the pain score provided to the patient today. Pain Type: Chronic pain Pain Location: Neck Pain Orientation: Right Pain Descriptors / Indicators: Aching, Constant Pain Frequency: Constant  The patient comes into the clinics today for post-procedure evaluation on the interventional treatment done on 11/18/2015. In addition, he comes in today for pharmacological management of his chronic pain.  The patient  reports that he does not use drugs. Heart rate irregular. Took manually and got pulse of 32. Took again and got a pulse of 50. Patient asymptomatic. States his heart rate has been low. The patient has been having some problems with his heart rate, which I'm sure have to do with his methadone. For the longest time we have talked about switching him off of the methadone but he has resisted this idea. I have also try to prevent problems with heart rate by giving him they magnesium, but he is not very compliant. At this point, I am convinced that if I don't start tapering him off of the methadone he seemed to have some serious problems with it. Today we again talked about this and I have come up with a regimen to taper him off of the medicine by going down slowly. Will go down by 5 mg per week until we can completely stop it. Since he is currently taking 60 mg per day this means that it will take me 12 weeks to have the him completely off of the medication.  Date of Last Visit: 11/18/15 Service Provided on Last Visit: Procedure, Med Refill (trigger point injection)  Controlled Substance Pharmacotherapy Assessment & REMS (Risk Evaluation and Mitigation Strategy)  Analgesic:Methadone 10 mg 2 tablets every 8 hours (60 mg/day of methadone) MME/day:600 mg/day  Pill Count: Bottle Labeled methadone 10 mg #  39/180.  Filled 12-25-15. Bottle labeled lyrica 150 mg #31/60  Filled 12-29-15. Pharmacokinetics: Onset  of action (Liberation/Absorption): Within expected pharmacological parameters Time to Peak effect (Distribution): Timing and results are as within normal expected parameters Duration of action (Metabolism/Excretion): Within normal limits for medication Pharmacodynamics: Analgesic Effect: More than 50% Activity Facilitation: Medication(s) allow patient to sit, stand, walk, and do the basic ADLs Perceived Effectiveness: Described as relatively effective, allowing for increase in activities of daily living (ADL) Side-effects or Adverse reactions: None reported Monitoring: [place] PMP: Online review of the past 20-month period conducted. Compliant with practice rules and regulations List of all UDS test(s) done:  Lab Results  Component Value Date   TOXASSSELUR FINAL 11/18/2015   TOXASSSELUR FINAL 09/16/2015   TOXASSSELUR FINAL 05/24/2015   TOXASSSELUR FINAL 03/29/2015   Last UDS on record: ToxAssure Select 13  Date Value Ref Range Status  11/18/2015 FINAL  Final    Comment:    ==================================================================== TOXASSURE SELECT 13 (MW) ==================================================================== Test                             Result       Flag       Units Drug Present and Declared for Prescription Verification   Methadone                      405          EXPECTED   ng/mg creat   EDDP (Methadone Mtb)           5466         EXPECTED   ng/mg creat    Sources of methadone include scheduled prescription medications.    EDDP is an expected metabolite of methadone. ==================================================================== Test                      Result    Flag   Units      Ref Range   Creatinine              120              mg/dL      >=20 ==================================================================== Declared Medications:  The flagging and interpretation on this report are based on the  following declared medications.  Unexpected  results may arise from  inaccuracies in the declared medications.  **Note: The testing scope of this panel includes these medications:  Methadone (Dolophine)  **Note: The testing scope of this panel does not include following  reported medications:  Aspirin (Aspirin 81)  Atorvastatin (Lipitor)  Docusate (Colace)  Finasteride (Proscar)  Lubiprostone (Amitiza)  Magnesium (Mag-Ox)  Naloxone  Pregabalin (Lyrica)  Promethazine (Phenergan)  Sumatriptan (Imitrex)  Supplement  Tamsulosin (Flomax)  Vitamin D3 ==================================================================== For clinical consultation, please call [phone]. ====================================================================    UDS interpretation: Compliant          Medication Assessment Form: Reviewed. Patient indicates being compliant with therapy Treatment compliance: Compliant Risk Assessment: Aberrant Behavior: None observed today Substance Use Disorder (SUD) Risk Level: High Risk of opioid abuse or dependence: 0.7-3.0% with doses ? 36 MME/day and 6.1-26% with doses ? 120 MME/day. Opioid Risk Tool (ORT) Score:   3 Low Risk for SUD (Score <3) Depression Scale Score: PHQ-2: 0 No depression (0) PHQ-9: 0 No depression (0-4)  Pharmacologic Plan: No change in therapy, at this time  Post-Procedure Assessment  Procedure done on 11/18/2015: Trapezius muscle  trigger point. Complications experienced at the time of the procedure: None Side-effects or Adverse reactions: None reported Sedation: Please see nurses note. When no sedatives are used, the analgesic levels obtained are directly associated with the effectiveness of the local anesthetics. On the other hand, when sedation is provided, the level of analgesia obtained during the initial 1 hour, immediately following the intervention, is believed to be the result of a combination of factors. These factors may include, but are not limited to: 1. The effectiveness of  the local anesthetics used. 2. The effects of the analgesic(s) and/or anxiolytic(s) used. 3. The degree of discomfort experienced by the patient at the time of the procedure. 4. The patients ability and reliability in recalling and recording the events. 5. The presence and influence of possible secondary gains. Results: Relief during the 1st hour after the procedure: 100 % (Ultra-Short Term Relief) Interpretative note: Analgesia during this period is likely to be Local Anesthetic and/or IV Sedative (Analgesic/Anxiolitic) related Local Anesthesia: Long-acting (4-6 hours) anesthetics used. The analgesic levels attained during this period are directly associated to the localized infiltration of local anesthetics and therefore cary significant diagnostic value as to the etiological location or origin of the pain. Results: Relief during the next 4 to 6 hour after the procedure: 100 % (Short Term Relief) Interpretative note: Complete relief confirms area to be the source of pain Long-Term Therapy: Steroids used. Results: Extended relief following procedure: 0 % Interpretative note: Long-term benefit would suggest an inflammatory etiology to the pain.         Long-Term Benefits:  Current Relief (Now): <50%  Interpretative note: Ongoing improvement of symptoms would suggest either persistent anti-inflammatory benefits or prolonged sympathectomy, in the case of sympathetically-mediated pain Interpretation of Results: The patient is a very poor historian and he also has a little difficulty connecting the dots. He's been having some pain in the occipital region of the cervical spine and skull with daily headaches on the top of the head, following the greater occipital nerve distribution. 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  Laboratory Chemistry   Inflammation Markers Lab Results  Component Value Date   ESRSEDRATE 11 03/29/2015   CRP 0.5 03/29/2015   Renal Function Lab Results  Component Value Date   BUN 24 (H) 03/29/2015   CREATININE 0.89 03/29/2015   GFRAA >60 03/29/2015   GFRNONAA >60 03/29/2015   Hepatic Function Lab Results  Component Value Date   AST 26 03/29/2015   ALT 16 (L) 03/29/2015   ALBUMIN 3.9 03/29/2015   Electrolytes Lab Results  Component Value Date   NA 141 03/29/2015   K 4.6 03/29/2015   CL 107 03/29/2015   CALCIUM 9.0 03/29/2015   MG 2.2 03/29/2015   Pain Modulating Vitamins Lab Results  Component Value Date   25OHVITD1 24 (L) 03/29/2015   25OHVITD2 <1.0 03/29/2015   25OHVITD3 24 03/29/2015   Coagulation Parameters No results found for: INR, LABPROT, APTT, PLT Cardiovascular No results found for: BNP, HGB, HCT  Note: Lab results reviewed.  Recent Diagnostic Imaging  No results found.  Meds  The patient has a current medication list which includes the following prescription(s): aspirin, atorvastatin, vitamin d3, docusate sodium, finasteride, lubiprostone, magnesium oxide, methadone, methadone, methadone, methadone, methadone, methadone, naloxone hcl, naproxen, pregabalin, promethazine, sumatriptan, tamsulosin, and benefiber.  Current  Outpatient Prescriptions on File Prior to Visit  Medication Sig  . Cholecalciferol (VITAMIN D3) 2000 units capsule Take 1 capsule (2,000 Units total) by mouth daily.  . magnesium oxide (MAG-OX) 400 MG tablet Take 1 tablet (400 mg total) by mouth daily.  . Naloxone HCl (NARCAN) 4 MG/0.1ML LIQD Place 1 spray into the nose once. Spray half of bottle content into each nostril, then call 911  . promethazine (PHENERGAN) 25 MG tablet Take 25 mg by mouth every 6 (six) hours as needed for nausea or vomiting.  . tamsulosin (FLOMAX) 0.4 MG CAPS capsule Take 0.4 mg by mouth daily.  . Wheat Dextrin (BENEFIBER) POWD Stir 2 tsp. TID into 4-8 oz of any non-carbonated  beverage or soft food (hot or cold)   No current facility-administered medications on file prior to visit.     ROS  Constitutional: Denies any fever or chills Gastrointestinal: No reported hemesis, hematochezia, vomiting, or acute GI distress Musculoskeletal: Denies any acute onset joint swelling, redness, loss of ROM, or weakness Neurological: No reported episodes of acute onset apraxia, aphasia, dysarthria, agnosia, amnesia, paralysis, loss of coordination, or loss of consciousness  Allergies  Mr. Schill has No Known Allergies.  [place]  Medical:  Mr. 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 Family: family history includes Alcohol abuse in his father; Alzheimer's disease in his mother; Cancer in his father. Surgical:  has a past surgical history that includes Spine surgery. Tobacco:  reports that he quit smoking about 13 months ago. He has never used smokeless tobacco. Alcohol:  reports that he drinks alcohol. Drug:  reports that he does not use drugs.  Constitutional Exam  General appearance: Well nourished, well developed, and well hydrated. In no acute distress Vitals:   01/17/16 1317  BP: (!) 143/73  Pulse: 62  Resp: 18  Temp: 97.9 F (36.6 C)  SpO2: 99%  Weight: 200 lb (90.7 kg)  Height: 5\' 10"  (1.778 m)  BMI Assessment: Estimated body mass index is 28.7 kg/m as calculated from the following:   Height as of this encounter: 5\' 10"  (1.778 m).   Weight as of this encounter: 200 lb (90.7 kg).   BMI interpretation: (25-29.9  kg/m2) = Overweight: This range is associated with a 20% higher incidence of chronic pain. BMI Readings from Last 4 Encounters:  01/17/16 28.70 kg/m  11/18/15 28.70 kg/m  09/16/15 28.70 kg/m  07/19/15 27.84 kg/m   Wt Readings from Last 4 Encounters:  01/17/16 200 lb (90.7 kg)  11/18/15 200 lb (90.7 kg)  09/16/15 200 lb (90.7 kg)  07/19/15 194 lb (88 kg)  Psych/Mental status: Alert and oriented x 3 (person, place, & time) Eyes: PERLA Respiratory: No evidence of acute respiratory distress  Cervical Spine Exam  Inspection: No masses, redness, or swelling Alignment: Symmetrical Functional ROM: Decreased ROM Stability: No instability detected Muscle strength & Tone: Functionally intact Sensory: Movement-associated pain Palpation: Complains of area being tender to palpation  Upper Extremity (UE) Exam    Side: Right upper extremity  Side: Left upper extremity  Inspection: No masses, redness, swelling, or asymmetry  Inspection: No masses, redness, swelling, or asymmetry  Functional ROM: Unrestricted ROM  Functional ROM: Unrestricted ROM          Muscle strength & Tone: Functionally intact  Muscle strength & Tone: Functionally intact  Sensory: Unimpaired  Sensory: Unimpaired  Palpation: Non-contributory  Palpation: Non-contributory   Thoracic Spine Exam  Inspection: No masses, redness, or swelling Alignment: Symmetrical Functional ROM: Unrestricted ROM Stability: No instability detected Sensory: Unimpaired Muscle strength & Tone: Functionally intact Palpation: Non-contributory  Lumbar Spine Exam  Inspection: No masses, redness, or swelling Alignment: Symmetrical Functional ROM: Unrestricted ROM Stability: No instability detected Muscle strength & Tone: Functionally intact Sensory: Unimpaired Palpation: Non-contributory Provocative Tests: Lumbar Hyperextension and rotation test: evaluation deferred today       Patrick's Maneuver: evaluation deferred today               Gait & Posture Assessment  Ambulation: Unassisted Gait: Relatively normal for age and body habitus Posture: WNL   Lower Extremity Exam    Side: Right lower extremity  Side: Left lower extremity  Inspection: No masses, redness, swelling, or asymmetry  Inspection: No masses, redness, swelling, or asymmetry  Functional ROM: Unrestricted ROM          Functional ROM: Unrestricted ROM          Muscle strength & Tone: Functionally intact  Muscle strength & Tone: Functionally intact  Sensory: Unimpaired  Sensory: Unimpaired  Palpation: Non-contributory  Palpation: Non-contributory   Assessment  Primary Diagnosis & Pertinent Problem List: The primary encounter diagnosis was Chronic pain. 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  Visit Diagnosis: 1. Chronic pain   2. Long term current use of opiate analgesic   3. Methadone use ([facility])   4. Opiate use (600 MME/Day)   5. Neurogenic pain   6. Therapeutic opioid-induced constipation (OIC)   7. Chronic neck pain   8. Illicit drug use    Plan of Care   Problem List Items Addressed This Visit      High   Chronic neck pain (Chronic)   Relevant Medications   naproxen (NAPROSYN) 500 MG tablet   aspirin 81 MG chewable tablet   methadone (DOLOPHINE) 10 MG tablet (Start on 01/21/2016)   methadone (DOLOPHINE) 5 MG tablet (Start on 01/21/2016)   methadone (DOLOPHINE) 5 MG tablet (Start on 01/28/2016)   methadone (DOLOPHINE) 5 MG tablet (Start on 02/04/2016)   methadone (DOLOPHINE) 5 MG tablet (Start on 02/11/2016)   methadone (DOLOPHINE) 5 MG tablet (Start on 02/18/2016)   pregabalin (LYRICA) 150 MG capsule   Chronic pain - Primary (Chronic)   Relevant Medications   naproxen (NAPROSYN) 500 MG tablet   aspirin 81 MG chewable tablet   methadone  (DOLOPHINE) 10 MG tablet (Start on 01/21/2016)   methadone (DOLOPHINE) 5 MG tablet (Start on 01/21/2016)   methadone (DOLOPHINE) 5 MG tablet (Start on 01/28/2016)   methadone (DOLOPHINE) 5 MG tablet (Start on 02/04/2016)   methadone (DOLOPHINE) 5 MG tablet (Start on 02/11/2016)   methadone (DOLOPHINE) 5 MG tablet (Start on 02/18/2016)   pregabalin (LYRICA) 150 MG capsule   Neurogenic pain (Chronic)   Relevant Medications   pregabalin (LYRICA) 150 MG capsule     Medium   Illicit drug use   Long term current use of opiate analgesic (Chronic)   Methadone use ([facility]) (Chronic)   Opiate use (600 MME/Day) (Chronic)   Opioid-induced constipation (  OIC) (Chronic)   Relevant Medications   lubiprostone (AMITIZA) 24 MCG capsule    Other Visit Diagnoses   None.    Pharmacotherapy (Medications Ordered): Meds ordered this encounter  Medications  . methadone (DOLOPHINE) 10 MG tablet    Sig: Take 1 tablet (10 mg total) by mouth every 8 (eight) hours.    Dispense:  90 tablet    Refill:  0    Do not place this medication, or any other prescription from our practice, on "Automatic Refill". Patient may have prescription filled one day early if pharmacy is closed on scheduled refill date. Do not fill until: 01/21/16 To last until: 02/20/16  . methadone (DOLOPHINE) 5 MG tablet    Sig: Take 2 tablets (10 mg total) by mouth every 8 (eight) hours. Max: 5/day    Dispense:  35 tablet    Refill:  0    Do not add this medication to the electronic "Automatic Refill" notification system. Patient may have prescription filled one day early if pharmacy is closed on scheduled refill date. Do not fill until: 01/21/16 To last until: 01/28/16  . methadone (DOLOPHINE) 5 MG tablet    Sig: Take 2 tablets (10 mg total) by mouth every 12 (twelve) hours. Max: 4/day    Dispense:  28 tablet    Refill:  0    Do not add this medication to the electronic "Automatic Refill" notification system. Patient may have prescription  filled one day early if pharmacy is closed on scheduled refill date. Do not fill until: 01/28/16 To last until: 02/04/16  . methadone (DOLOPHINE) 5 MG tablet    Sig: Take 1 tablet (5 mg total) by mouth every 8 (eight) hours. Max: 3/day    Dispense:  21 tablet    Refill:  0    Do not add this medication to the electronic "Automatic Refill" notification system. Patient may have prescription filled one day early if pharmacy is closed on scheduled refill date. Do not fill until: 02/04/16 To last until: 02/11/16  . methadone (DOLOPHINE) 5 MG tablet    Sig: Take 1 tablet (5 mg total) by mouth every 12 (twelve) hours. Max: 2/day    Dispense:  14 tablet    Refill:  0    Do not add this medication to the electronic "Automatic Refill" notification system. Patient may have prescription filled one day early if pharmacy is closed on scheduled refill date. Do not fill until: 02/11/16 To last until: 02/18/16  . methadone (DOLOPHINE) 5 MG tablet    Sig: Take 1 tablet (5 mg total) by mouth daily. Max: 1/day    Dispense:  7 tablet    Refill:  0    Do not add this medication to the electronic "Automatic Refill" notification system. Patient may have prescription filled one day early if pharmacy is closed on scheduled refill date. Do not fill until: 02/18/16 To last until: 02/25/16  . pregabalin (LYRICA) 150 MG capsule    Sig: Take 1 capsule (150 mg total) by mouth 2 (two) times daily.    Dispense:  60 capsule    Refill:  1    Do not place this medication, or any other prescription from our practice, on "Automatic Refill". Patient may have prescription filled one day early if pharmacy is closed on scheduled refill date.  . lubiprostone (AMITIZA) 24 MCG capsule    Sig: Take 1 capsule (24 mcg total) by mouth 2 (two) times daily with a meal. Swallow the medication  whole. Do not break or chew the medication.    Dispense:  60 capsule    Refill:  1    Do not place this medication, or any other prescription  from our practice, on "Automatic Refill". Patient may have prescription filled one day early if pharmacy is closed on scheduled refill date.   New Prescriptions   METHADONE (DOLOPHINE) 5 MG TABLET    Take 2 tablets (10 mg total) by mouth every 8 (eight) hours. Max: 5/day   METHADONE (DOLOPHINE) 5 MG TABLET    Take 2 tablets (10 mg total) by mouth every 12 (twelve) hours. Max: 4/day   METHADONE (DOLOPHINE) 5 MG TABLET    Take 1 tablet (5 mg total) by mouth every 8 (eight) hours. Max: 3/day   METHADONE (DOLOPHINE) 5 MG TABLET    Take 1 tablet (5 mg total) by mouth every 12 (twelve) hours. Max: 2/day   METHADONE (DOLOPHINE) 5 MG TABLET    Take 1 tablet (5 mg total) by mouth daily. Max: 1/day   Medications administered during this visit: Mr. Olave had no medications administered during this visit. Lab-work, Procedure(s), & Referral(s) Ordered: No orders of the defined types were placed in this encounter.  Imaging & Referral(s) Ordered: None  Interventional Therapies: Scheduled:None at this point.    Considering: Cervical epidural steroid injection under fluoroscopic guidance, with or without sedation.    PRN Procedures: Right sided cervical epidural steroid injection under fluoroscopic guidance, without without sedation.    Requested PM Follow-up: Return in about 4 weeks (around 02/14/2016) for Med-Mgmt, In addition, (PRN) Procedure.  Future Appointments Date Time Provider [place]  02/10/2016 8:00 AM Milinda Pointer, MD [facility] None    Primary Care Physician: Fritzi Mandes, MD Location: [facility] Out[facility] Management Facility Note by: Kathlen Brunswick Dossie Arbour, M.D, DABA, DABAPM, DABPM, DABIPP, FIPP  Pain Score Disclaimer: We use the NRS-11 scale. This is a self-reported, subjective measurement of pain severity with only modest accuracy. It is used primarily to identify changes within a particular patient. It must be understood that outpatient pain scales  are significantly less accurate that those used for research, where they can be applied under ideal controlled circumstances with minimal exposure to variables. In reality, the score is likely to be a combination of pain intensity and pain affect, where pain affect describes the degree of emotional arousal or changes in action readiness caused by the sensory experience of pain. Factors such as social and work situation, setting, emotional state, anxiety levels, expectation, and prior pain experience may influence pain perception and show large inter-individual differences that may also be affected by time variables.  Patient instructions provided during this appointment: There are no Patient Instructions on file for this visit.

## 2016-01-17 NOTE — Progress Notes (Signed)
Safety precautions to be maintained throughout the outpatient stay will include: orient to surroundings, keep bed in low position, maintain call bell within reach at all times, provide assistance with transfer out of bed and ambulation. Bottle  Labeled methadone 10 mg #39/180  Filled 12-25-15 Bottle labeled lyrica 150 mg #31/60  Filled 12-29-15  Heart rate irregular.  Took manually and got pulse of 32.  Took again and got a pulse of 50.  Patient asymptomatic.  States his heart rate has been low.

## 2016-02-10 ENCOUNTER — Telehealth: Payer: Self-pay

## 2016-02-10 ENCOUNTER — Ambulatory Visit: Payer: Medicare Other | Attending: Pain Medicine | Admitting: Pain Medicine

## 2016-02-10 ENCOUNTER — Encounter: Payer: Self-pay | Admitting: Pain Medicine

## 2016-02-10 VITALS — BP 149/80 | HR 84 | Temp 98.6°F | Resp 16 | Ht 70.0 in | Wt 200.0 lb

## 2016-02-10 DIAGNOSIS — I10 Essential (primary) hypertension: Secondary | ICD-10-CM | POA: Diagnosis not present

## 2016-02-10 DIAGNOSIS — Z7982 Long term (current) use of aspirin: Secondary | ICD-10-CM | POA: Insufficient documentation

## 2016-02-10 DIAGNOSIS — G894 Chronic pain syndrome: Secondary | ICD-10-CM

## 2016-02-10 DIAGNOSIS — E782 Mixed hyperlipidemia: Secondary | ICD-10-CM | POA: Diagnosis not present

## 2016-02-10 DIAGNOSIS — Z79899 Other long term (current) drug therapy: Secondary | ICD-10-CM | POA: Diagnosis not present

## 2016-02-10 DIAGNOSIS — M4802 Spinal stenosis, cervical region: Secondary | ICD-10-CM | POA: Insufficient documentation

## 2016-02-10 DIAGNOSIS — K219 Gastro-esophageal reflux disease without esophagitis: Secondary | ICD-10-CM | POA: Diagnosis not present

## 2016-02-10 DIAGNOSIS — M1990 Unspecified osteoarthritis, unspecified site: Secondary | ICD-10-CM | POA: Diagnosis not present

## 2016-02-10 DIAGNOSIS — G5603 Carpal tunnel syndrome, bilateral upper limbs: Secondary | ICD-10-CM | POA: Diagnosis not present

## 2016-02-10 DIAGNOSIS — F1190 Opioid use, unspecified, uncomplicated: Secondary | ICD-10-CM

## 2016-02-10 DIAGNOSIS — T402X5A Adverse effect of other opioids, initial encounter: Secondary | ICD-10-CM | POA: Insufficient documentation

## 2016-02-10 DIAGNOSIS — M791 Myalgia: Secondary | ICD-10-CM | POA: Diagnosis not present

## 2016-02-10 DIAGNOSIS — K5903 Drug induced constipation: Secondary | ICD-10-CM | POA: Insufficient documentation

## 2016-02-10 DIAGNOSIS — F1120 Opioid dependence, uncomplicated: Secondary | ICD-10-CM | POA: Diagnosis not present

## 2016-02-10 DIAGNOSIS — Z86010 Personal history of colonic polyps: Secondary | ICD-10-CM | POA: Diagnosis not present

## 2016-02-10 DIAGNOSIS — M792 Neuralgia and neuritis, unspecified: Secondary | ICD-10-CM

## 2016-02-10 DIAGNOSIS — M79641 Pain in right hand: Secondary | ICD-10-CM | POA: Diagnosis present

## 2016-02-10 DIAGNOSIS — Z87891 Personal history of nicotine dependence: Secondary | ICD-10-CM | POA: Insufficient documentation

## 2016-02-10 DIAGNOSIS — M25511 Pain in right shoulder: Secondary | ICD-10-CM | POA: Diagnosis not present

## 2016-02-10 DIAGNOSIS — Z79891 Long term (current) use of opiate analgesic: Secondary | ICD-10-CM

## 2016-02-10 MED ORDER — METHADONE HCL 5 MG PO TABS: 5.0000 mg | ORAL_TABLET | Freq: Two times a day (BID) | ORAL | 0 refills | Status: DC

## 2016-02-10 MED ORDER — METHADONE HCL 5 MG PO TABS: 5.0000 mg | ORAL_TABLET | Freq: Four times a day (QID) | ORAL | 0 refills | Status: DC

## 2016-02-10 MED ORDER — METHADONE HCL 5 MG PO TABS: 5.0000 mg | ORAL_TABLET | Freq: Every day | ORAL | 0 refills | Status: DC

## 2016-02-10 MED ORDER — OXYCODONE HCL 10 MG PO TABS
10.0000 mg | ORAL_TABLET | Freq: Three times a day (TID) | ORAL | 0 refills | Status: DC | PRN
Start: 2016-04-22 — End: 2016-05-18

## 2016-02-10 MED ORDER — PREGABALIN 150 MG PO CAPS: 150.0000 mg | ORAL_CAPSULE | Freq: Three times a day (TID) | ORAL | 0 refills | Status: DC

## 2016-02-10 MED ORDER — METHADONE HCL 5 MG PO TABS
5.0000 mg | ORAL_TABLET | Freq: Three times a day (TID) | ORAL | 0 refills | Status: DC
Start: 2016-03-17 — End: 2016-05-18

## 2016-02-10 MED ORDER — METHADONE HCL 5 MG PO TABS: 10.0000 mg | ORAL_TABLET | Freq: Three times a day (TID) | ORAL | 0 refills | Status: DC

## 2016-02-10 MED ORDER — METHADONE HCL 10 MG PO TABS: 10.0000 mg | ORAL_TABLET | Freq: Three times a day (TID) | ORAL | 0 refills | Status: DC

## 2016-02-10 NOTE — Progress Notes (Signed)
Patient's Name: Craig Alvarado  MRN: [id number]  Referring Provider: Fritzi Mandes, MD  DOB: [birth year]  PCP: Fritzi Mandes, MD  DOS: 02/10/2016  Note by: Kathlen Brunswick. Dossie Arbour, MD  Service setting: Ambulatory outpatient  Specialty: Interventional Pain Management  Location: [facility] (AMB) Pain Management Facility    Patient type: Established   Primary Reason(s) for Visit: Encounter for prescription drug management (Level of risk: moderate) CC: Hand Pain (bilateral)  HPI  Craig Alvarado is a 70 y.o. year old, male patient, who comes today for an initial evaluation. 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[place]); Encounter for therapeutic drug level monitoring; Encounter for chronic pain management; Opioid dependence ([place]); Opioid-induced constipation ([facility]); Chronic cervical radicular pain (Right); 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 His primarily concern today is the Hand Pain (bilateral)  Pain Assessment: Self-Reported Pain Score: 4  (pain scale sheet given )/10             Reported level is compatible with observation.       Pain Type: Chronic pain Pain Location: Hand (bilateral hands, right shoulder) Pain Orientation: Right,  Left Pain Descriptors / Indicators: Aching, Constant Pain Frequency: Constant  Craig Alvarado was last seen on 01/17/2016 for medication management. During today's appointment we reviewed Craig Alvarado chronic pain status, as well as his outpatient medication regimen. Assessment details and plan are as follows.  We will continue to taper his methadone down onto we go to 0 for a period of 2 weeks. After he completes that drug holiday, then we will started him back on oxycodone 10 mg 3 times a day. Today he indicates the primary pain to be that of the carpal tunnel and therefore we will bring him back for injections.  The patient  reports that he does not use drugs. His body mass index is 28.7 kg/m.  Controlled Substance Pharmacotherapy Assessment REMS (Risk Evaluation and Mitigation Strategy)  Analgesic:Methadone 10 mg 2 tablets every 8 hours (60 mg/day of methadone) Initial MME/day:600 mg/day Current MME/day:300 mg/day Medication Inspection Compliance: Pill count conducted under aseptic conditions, in front of the patient. Neither the pills nor the bottle was removed from the patient's sight at any time. Once count was completed pills were immediately returned to the patient in their original bottle. Pill Count: 24 of 90 pills remain Bottle Appearance: Standard pharmacy container. Clearly labeled. Medication: Methadone 10 mg Filled Date: 09 / 30 / 2017   Medication Inspection Compliance: Pill count conducted under aseptic conditions, in front of the patient. Neither the pills nor the bottle was removed from the patient's sight at any time. Once count was completed pills were immediately returned to the patient in their original bottle. Pill Count: 35 of 28 pills remain Bottle Appearance: Standard pharmacy container. Clearly labeled. Medication: Methadone 5mg  Filled Date: 10 / 16 / 2017 Pharmacokinetics: Onset of action (Liberation/Absorption): Within expected pharmacological  parameters Time to Peak effect (Distribution): Timing and results are as within normal expected parameters Duration of action (Metabolism/Excretion): Within normal limits for medication Pharmacodynamics: Analgesic Effect: More than 50% Activity Facilitation: Medication(s) allow patient to sit, stand, walk, and do the basic ADLs Perceived Effectiveness: Described as relatively effective, allowing for increase in activities of daily living (ADL) Side-effects or Adverse reactions: None reported Monitoring: [place] PMP: Online review of the past 84-month period conducted. Compliant with practice rules and regulations List of all UDS test(s) done:  Lab Results  Component Value Date   TOXASSSELUR FINAL 11/18/2015   TOXASSSELUR FINAL 09/16/2015   TOXASSSELUR FINAL 05/24/2015   TOXASSSELUR FINAL 03/29/2015   Last UDS on record: ToxAssure Select 13  Date Value Ref Range Status  11/18/2015 FINAL  Final    Comment:    ==================================================================== TOXASSURE SELECT 13 (MW) ==================================================================== Test                             Result       Flag       Units Drug Present and Declared for Prescription Verification   Methadone                      405          EXPECTED   ng/mg creat   EDDP (Methadone Mtb)           5466         EXPECTED   ng/mg creat    Sources of methadone include scheduled prescription medications.    EDDP is an expected metabolite of methadone. ==================================================================== Test                      Result    Flag   Units      Ref Range   Creatinine              120              mg/dL      >=20 ==================================================================== Declared Medications:  The flagging and interpretation on this report are based on the  following declared medications.  Unexpected results may arise from  inaccuracies in the declared  medications.  **Note: The testing scope of this panel includes these medications:  Methadone (Dolophine)  **Note: The testing scope of this panel does not include following  reported medications:  Aspirin (Aspirin 81)  Atorvastatin (Lipitor)  Docusate (Colace)  Finasteride (Proscar)  Lubiprostone (Amitiza)  Magnesium (Mag-Ox)  Naloxone  Pregabalin (Lyrica)  Promethazine (Phenergan)  Sumatriptan (Imitrex)  Supplement  Tamsulosin (Flomax)  Vitamin D3 ==================================================================== For clinical consultation, please call [phone]. ====================================================================    UDS interpretation: Compliant          Medication Assessment Form: Reviewed. Patient indicates being compliant with therapy Treatment compliance: Compliant Risk Assessment Profile: Aberrant/High Risk Behavior: None observed or detected today Risk Factors for Fatal Opioid Overdose: None new ones identified today Substance Use Disorder (SUD) Risk Level: Low Opioid Risk Tool (ORT) Total Score: 3  ORT Score Interpretation Table:  Score <3 = Low Risk for SUD  Score between 4-7 = Moderate Risk for SUD  Score >8 = High Risk for Opioid Abuse   Risk Mitigation Strategies:  Patient Counseling:  Covered Patient-Prescriber Agreement (PPA): Present and active  Notification to other healthcare providers: Done  Pharmacologic Plan: No change in therapy, at this time  Laboratory  Chemistry  Inflammation Markers Lab Results  Component Value Date   ESRSEDRATE 11 03/29/2015   CRP 0.5 03/29/2015   Renal Function Lab Results  Component Value Date   BUN 24 (H) 03/29/2015   CREATININE 0.89 03/29/2015   GFRAA >60 03/29/2015   GFRNONAA >60 03/29/2015   Hepatic Function Lab Results  Component Value Date   AST 26 03/29/2015   ALT 16 (L) 03/29/2015   ALBUMIN 3.9 03/29/2015   Electrolytes Lab Results  Component Value Date   NA 141 03/29/2015    K 4.6 03/29/2015   CL 107 03/29/2015   CALCIUM 9.0 03/29/2015   MG 2.2 03/29/2015   Pain Modulating Vitamins Lab Results  Component Value Date   25OHVITD1 24 (L) 03/29/2015   25OHVITD2 <1.0 03/29/2015   25OHVITD3 24 03/29/2015   Coagulation Parameters No results found for: INR, LABPROT, APTT, PLT Cardiovascular No results found for: BNP, HGB, HCT Note: Lab results reviewed.  Recent Diagnostic Imaging Review  No results found.  Meds  The patient has a current medication list which includes the following prescription(s): aspirin, atorvastatin, vitamin d3, docusate sodium, finasteride, lubiprostone, magnesium oxide, methadone, methadone, methadone, methadone, methadone, methadone, methadone, naloxone hcl, oxycodone hcl, pregabalin, promethazine, sumatriptan, tamsulosin, and benefiber.  Current Outpatient Prescriptions on File Prior to Visit  Medication Sig  . aspirin 81 MG chewable tablet Chew 81 mg by mouth.  Marland Kitchen atorvastatin (LIPITOR) 40 MG tablet Take 40 mg by mouth daily at 6 PM.   . Cholecalciferol (VITAMIN D3) 2000 units capsule Take 1 capsule (2,000 Units total) by mouth daily.  Marland Kitchen docusate sodium (STOOL SOFTENER) 100 MG capsule Take by mouth.  . finasteride (PROSCAR) 5 MG tablet Take 5 mg by mouth.  . lubiprostone (AMITIZA) 24 MCG capsule Take 1 capsule (24 mcg total) by mouth 2 (two) times daily with a meal. Swallow the medication whole. Do not break or chew the medication.  . magnesium oxide (MAG-OX) 400 MG tablet Take 1 tablet (400 mg total) by mouth daily.  . Naloxone HCl (NARCAN) 4 MG/0.1ML LIQD Place 1 spray into the nose once. Spray half of bottle content into each nostril, then call 911  . promethazine (PHENERGAN) 25 MG tablet Take 25 mg by mouth every 6 (six) hours as needed for nausea or vomiting.  . SUMAtriptan (IMITREX) 50 MG tablet Take 50 mg by mouth.  . tamsulosin (FLOMAX) 0.4 MG CAPS capsule Take 0.4 mg by mouth daily.  . Wheat Dextrin (BENEFIBER) POWD Stir 2  tsp. TID into 4-8 oz of any non-carbonated beverage or soft food (hot or cold)   No current facility-administered medications on file prior to visit.    ROS  Constitutional: Denies any fever or chills Gastrointestinal: No reported hemesis, hematochezia, vomiting, or acute GI distress Musculoskeletal: Denies any acute onset joint swelling, redness, loss of ROM, or weakness Neurological: No reported episodes of acute onset apraxia, aphasia, dysarthria, agnosia, amnesia, paralysis, loss of coordination, or loss of consciousness  Allergies  Mr. Habte has No Known Allergies.  PFSH  Drug: Mr. Vernick  reports that he does not use drugs. Alcohol:  reports that he drinks alcohol. Tobacco:  reports that he quit smoking about 14 months ago. He has never used smokeless tobacco. 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 Family: family history includes Alcohol abuse in his father; Alzheimer's disease in his mother; Cancer in his father.  Past Surgical History:  Procedure Laterality Date  . SPINE SURGERY     Constitutional Exam  General appearance: Well nourished, well developed, and well hydrated. In no apparent acute distress Vitals:   02/10/16 0811  BP: (!) 149/80  Pulse: 84  Resp: 16  Temp: 98.6 F (37 C)  SpO2: 97%  Weight: 200 lb (90.7 kg)  Height: 5\' 10"  (1.778 m)   BMI Assessment: Estimated body mass index is 28.7 kg/m as calculated from the following:   Height as of this encounter: 5\' 10"  (1.778 m).   Weight as of this encounter:  200 lb (90.7 kg).  BMI interpretation table: BMI level Category Range association with higher incidence of chronic pain  <18 kg/m2 Underweight   18.5-24.9 kg/m2 Ideal body weight   25-29.9 kg/m2 Overweight Increased incidence by 20%  30-34.9 kg/m2 Obese (Class I) Increased incidence by 68%  35-39.9 kg/m2 Severe obesity (Class II) Increased incidence by 136%  >40 kg/m2 Extreme obesity (Class III) Increased incidence by 254%   BMI Readings from Last 4 Encounters:  02/10/16 28.70 kg/m  01/17/16 28.70 kg/m  11/18/15 28.70 kg/m  09/16/15 28.70 kg/m   Wt Readings from Last 4 Encounters:  02/10/16 200 lb (90.7 kg)  01/17/16 200 lb (90.7 kg)  11/18/15 200 lb (90.7 kg)  09/16/15 200 lb (90.7 kg)  Psych/Mental status: Alert, oriented x 3 (person, place, & time) Eyes: PERLA Respiratory: No evidence of acute respiratory distress  Cervical Spine Exam  Inspection: No masses, redness, or swelling Alignment: Symmetrical Functional ROM: Unrestricted ROM Stability: No instability detected Muscle strength & Tone: Functionally intact Sensory: Unimpaired Palpation: Non-contributory  Upper Extremity (UE) Exam    Side: Right upper extremity  Side: Left upper extremity  Inspection: No masses, redness, swelling, or asymmetry  Inspection: No masses, redness, swelling, or asymmetry  Functional ROM: Unrestricted ROM         Functional ROM: Unrestricted ROM          Muscle strength & Tone: Functionally intact  Muscle strength & Tone: Functionally intact  Sensory: Unimpaired  Sensory: Unimpaired  Palpation: Non-contributory  Palpation: Non-contributory   Thoracic Spine Exam  Inspection: No masses, redness, or swelling Alignment: Symmetrical Functional ROM: Unrestricted ROM Stability: No instability detected Sensory: Unimpaired Muscle strength & Tone: Functionally intact Palpation: Non-contributory  Lumbar Spine Exam  Inspection: No masses, redness, or swelling Alignment:  Symmetrical Functional ROM: Unrestricted ROM Stability: No instability detected Muscle strength & Tone: Functionally intact Sensory: Unimpaired Palpation: Non-contributory Provocative Tests: Lumbar Hyperextension and rotation test: evaluation deferred today       Patrick's Maneuver: evaluation deferred today              Gait & Posture Assessment  Ambulation: Unassisted Gait: Relatively normal for age and body habitus Posture: WNL   Lower Extremity Exam    Side: Right lower extremity  Side: Left lower extremity  Inspection: No masses, redness, swelling, or asymmetry  Inspection: No masses, redness, swelling, or asymmetry  Functional ROM: Unrestricted ROM          Functional ROM: Unrestricted ROM          Muscle strength & Tone: Functionally intact  Muscle strength & Tone: Functionally intact  Sensory: Unimpaired  Sensory: Unimpaired  Palpation: Non-contributory  Palpation: Non-contributory   Assessment  Primary Diagnosis & Pertinent Problem List: The primary encounter diagnosis was Chronic pain syndrome. Diagnoses of Bilateral carpal tunnel syndrome, Long term current use of opiate analgesic, Methadone use ([facility]), Opiate use (600 MME/Day), Uncomplicated opioid dependence ([place]), and Neurogenic pain were also pertinent to this visit.  Visit Diagnosis: 1. Chronic pain syndrome   2. Bilateral carpal tunnel syndrome   3. Long term current use of opiate analgesic   4. Methadone use ([facility])   5. Opiate use (600 MME/Day)   6. Uncomplicated opioid dependence ([place])   7. Neurogenic pain    Plan of Care  Pharmacotherapy (Medications Ordered): Meds ordered this encounter  Medications  . methadone (DOLOPHINE) 10 MG tablet    Sig: Take 1 tablet (10 mg total) by mouth every 8 (eight) hours.    Dispense:  12 tablet    Refill:  0    Do not place this medication, or any other prescription from our practice, on "Automatic Refill". Patient may have prescription filled one day early if pharmacy is  closed on scheduled refill date. Do not fill until: 02/21/16 To last until: 02/25/16  . methadone (DOLOPHINE) 5 MG tablet    Sig: Take 1 tablet (5 mg total) by mouth 3 (three) times daily. Max: 3/day    Dispense:  21 tablet    Refill:  0    Do not add this medication to the electronic "Automatic Refill" notification system. Patient may have prescription filled one day early if pharmacy is closed on scheduled refill date. Do not fill until: 03/17/16 To last until: 03/24/16  . methadone (DOLOPHINE) 5 MG tablet    Sig: Take 1 tablet (5 mg total) by mouth 2 (two) times daily. Max: 2/day    Dispense:  14 tablet    Refill:  0    Do not add this medication to the electronic "Automatic Refill" notification system. Patient may have prescription filled one day early if pharmacy is closed on scheduled refill date. Do not fill until: 03/24/16 To last until: 03/31/16  . methadone (DOLOPHINE) 5 MG tablet    Sig: Take 2 tablets (10 mg total) by mouth every 8 (eight) hours. Max: 6/day    Dispense:  42 tablet    Refill:  0    Do not add this medication to the electronic "Automatic Refill" notification system. Patient may have prescription filled one day early if pharmacy is closed on scheduled refill date. Do not fill until: 02/25/16 To last until: 03/03/16  . methadone (DOLOPHINE) 5 MG tablet    Sig: Take 1 tablet (5 mg total) by mouth 5 (five) times daily. Max: 5/day    Dispense:  35 tablet    Refill:  0    Do not add this medication to the electronic "Automatic Refill" notification system. Patient may have prescription filled one day early if pharmacy is closed on scheduled refill date. Do not fill until: 03/03/16 To last until: 03/10/16  . methadone (DOLOPHINE) 5 MG tablet    Sig: Take 1 tablet (5 mg total) by mouth 4 (four) times daily. Max: 4/day    Dispense:  28 tablet    Refill:  0    Do not add this medication to the electronic "Automatic Refill" notification system. Patient may have  prescription filled one day early if pharmacy is closed on scheduled refill date. Do not fill until: 03/10/16 To last until: 03/17/16  . methadone (DOLOPHINE) 5 MG tablet    Sig: Take 1 tablet (5 mg total)  by mouth daily. Max: 1/day    Dispense:  7 tablet    Refill:  0    Do not add this medication to the electronic "Automatic Refill" notification system. Patient may have prescription filled one day early if pharmacy is closed on scheduled refill date. Do not fill until: 03/31/16 To last until: 04/07/16  . Oxycodone HCl 10 MG TABS    Sig: Take 1 tablet (10 mg total) by mouth every 8 (eight) hours as needed.    Dispense:  90 tablet    Refill:  0    Do not place this medication, or any other prescription from our practice, on "Automatic Refill". Patient may have prescription filled one day early if pharmacy is closed on scheduled refill date. Do not fill until: 04/22/16 To last until: 05/22/16  . pregabalin (LYRICA) 150 MG capsule    Sig: Take 1 capsule (150 mg total) by mouth 3 (three) times daily.    Dispense:  270 capsule    Refill:  0    Do not place this medication, or any other prescription from our practice, on "Automatic Refill". Patient may have prescription filled one day early if pharmacy is closed on scheduled refill date.   New Prescriptions   OXYCODONE HCL 10 MG TABS    Take 1 tablet (10 mg total) by mouth every 8 (eight) hours as needed.   Medications administered during this visit: Mr. Ilacqua had no medications administered during this visit. Lab-work, Procedure(s), & Referral(s) Ordered: Orders Placed This Encounter  Procedures  . Therapeutic injection carpal tunnel   Imaging & Referral(s) Ordered: None  Interventional Therapies: Scheduled:None at this point.    Considering: Cervical epidural steroid injection under fluoroscopic guidance, with or without sedation.    PRN Procedures: Right sided cervical epidural steroid injection under fluoroscopic  guidance, without without sedation.    Requested PM Follow-up: Return in 3 months (on 05/18/2016) for Med-Mgmt, In addition, Schedule Procedure.  Future Appointments Date Time Provider [place]  02/15/2016 10:00 AM Milinda Pointer, MD [facility] None  05/18/2016 10:00 AM Milinda Pointer, MD [facility] None   Primary Care Physician: Fritzi Mandes, MD Location: [facility] Outpatient Pain Management Facility Note by: Kathlen Brunswick Dossie Arbour, M.D, DABA, DABAPM, DABPM, DABIPP, FIPP  Pain Score Disclaimer: We use the NRS-11 scale. This is a self-reported, subjective measurement of pain severity with only modest accuracy. It is used primarily to identify changes within a particular patient. It must be understood that outpatient pain scales are significantly less accurate that those used for research, where they can be applied under ideal controlled circumstances with minimal exposure to variables. In reality, the score is likely to be a combination of pain intensity and pain affect, where pain affect describes the degree of emotional arousal or changes in action readiness caused by the sensory experience of pain. Factors such as social and work situation, setting, emotional state, anxiety levels, expectation, and prior pain experience may influence pain perception and show large inter-individual differences that may also be affected by time variables.  Patient instructions provided during this appointment: Patient Instructions   GENERAL RISKS AND COMPLICATIONS  What are the risk, side effects and possible complications? Generally speaking, most procedures are safe.  However, with any procedure there are risks, side effects, and the possibility of complications.  The risks and complications are dependent upon the sites that are lesioned, or the type of nerve block to be performed.  The closer the procedure is to the spine, the  more serious the risks are.  Great care is taken when placing the  radio frequency needles, block needles or lesioning probes, but sometimes complications can occur. 1. Infection: Any time there is an injection through the skin, there is a risk of infection.  This is why sterile conditions are used for these blocks.  There are four possible types of infection. 1. Localized skin infection. 2. Central Nervous System Infection-This can be in the form of Meningitis, which can be deadly. 3. Epidural Infections-This can be in the form of an epidural abscess, which can cause pressure inside of the spine, causing compression of the spinal cord with subsequent paralysis. This would require an emergency surgery to decompress, and there are no guarantees that the patient would recover from the paralysis. 4. Discitis-This is an infection of the intervertebral discs.  It occurs in about 1% of discography procedures.  It is difficult to treat and it may lead to surgery.        2. Pain: the needles have to go through skin and soft tissues, will cause soreness.       3. Damage to internal structures:  The nerves to be lesioned may be near blood vessels or    other nerves which can be potentially damaged.       4. Bleeding: Bleeding is more common if the patient is taking blood thinners such as  aspirin, Coumadin, Ticiid, Plavix, etc., or if he/she have some genetic predisposition  such as hemophilia. Bleeding into the spinal canal can cause compression of the spinal  cord with subsequent paralysis.  This would require an emergency surgery to  decompress and there are no guarantees that the patient would recover from the  paralysis.       5. Pneumothorax:  Puncturing of a lung is a possibility, every time a needle is introduced in  the area of the chest or upper back.  Pneumothorax refers to free air around the  collapsed lung(s), inside of the thoracic cavity (chest cavity).  Another two possible  complications related to a similar event would include: Hemothorax and Chylothorax.    These are variations of the Pneumothorax, where instead of air around the collapsed  lung(s), you may have blood or chyle, respectively.       6. Spinal headaches: They may occur with any procedures in the area of the spine.       7. Persistent CSF (Cerebro-Spinal Fluid) leakage: This is a rare problem, but may occur  with prolonged intrathecal or epidural catheters either due to the formation of a fistulous  track or a dural tear.       8. Nerve damage: By working so close to the spinal cord, there is always a possibility of  nerve damage, which could be as serious as a permanent spinal cord injury with  paralysis.       9. Death:  Although rare, severe deadly allergic reactions known as "Anaphylactic  reaction" can occur to any of the medications used.      10. Worsening of the symptoms:  We can always make thing worse.  What are the chances of something like this happening? Chances of any of this occuring are extremely low.  By statistics, you have more of a chance of getting killed in a motor vehicle accident: while driving to the hospital than any of the above occurring .  Nevertheless, you should be aware that they are possibilities.  In general, it is similar to taking  a shower.  Everybody knows that you can slip, hit your head and get killed.  Does that mean that you should not shower again?  Nevertheless always keep in mind that statistics do not mean anything if you happen to be on the wrong side of them.  Even if a procedure has a 1 (one) in a 1,000,000 (million) chance of going wrong, it you happen to be that one..Also, keep in mind that by statistics, you have more of a chance of having something go wrong when taking medications.  Who should not have this procedure? If you are on a blood thinning medication (e.g. Coumadin, Plavix, see list of "Blood Thinners"), or if you have an active infection going on, you should not have the procedure.  If you are taking any blood thinners, please inform  your physician.  How should I prepare for this procedure?  Do not eat or drink anything at least six hours prior to the procedure.  Bring a driver with you .  It cannot be a taxi.  Come accompanied by an adult that can drive you back, and that is strong enough to help you if your legs get weak or numb from the local anesthetic.  Take all of your medicines the morning of the procedure with just enough water to swallow them.  If you have diabetes, make sure that you are scheduled to have your procedure done first thing in the morning, whenever possible.  If you have diabetes, take only half of your insulin dose and notify our nurse that you have done so as soon as you arrive at the clinic.  If you are diabetic, but only take blood sugar pills (oral hypoglycemic), then do not take them on the morning of your procedure.  You may take them after you have had the procedure.  Do not take aspirin or any aspirin-containing medications, at least eleven (11) days prior to the procedure.  They may prolong bleeding.  Wear loose fitting clothing that may be easy to take off and that you would not mind if it got stained with Betadine or blood.  Do not wear any jewelry or perfume  Remove any nail coloring.  It will interfere with some of our monitoring equipment.  NOTE: Remember that this is not meant to be interpreted as a complete list of all possible complications.  Unforeseen problems may occur.  BLOOD THINNERS The following drugs contain aspirin or other products, which can cause increased bleeding during surgery and should not be taken for 2 weeks prior to and 1 week after surgery.  If you should need take something for relief of minor pain, you may take acetaminophen which is found in Tylenol,m Datril, Anacin-3 and Panadol. It is not blood thinner. The products listed below are.  Do not take any of the products listed below in addition to any listed on your instruction sheet.  A.P.C or A.P.C  with Codeine Codeine Phosphate Capsules #3 Ibuprofen Ridaura  ABC compound Congesprin Imuran rimadil  Advil Cope Indocin Robaxisal  Alka-Seltzer Effervescent Pain Reliever and Antacid Coricidin or Coricidin-D  Indomethacin Rufen  Alka-Seltzer plus Cold Medicine Cosprin Ketoprofen S-A-C Tablets  Anacin Analgesic Tablets or Capsules Coumadin Korlgesic Salflex  Anacin Extra Strength Analgesic tablets or capsules CP-2 Tablets Lanoril Salicylate  Anaprox Cuprimine Capsules Levenox Salocol  Anexsia-D Dalteparin Magan Salsalate  Anodynos Darvon compound Magnesium Salicylate Sine-off  Ansaid Dasin Capsules Magsal Sodium Salicylate  Anturane Depen Capsules Marnal Soma  APF Arthritis pain formula  Dewitt's Pills Measurin Stanback  Argesic Dia-Gesic Meclofenamic Sulfinpyrazone  Arthritis Bayer Timed Release Aspirin Diclofenac Meclomen Sulindac  Arthritis pain formula Anacin Dicumarol Medipren Supac  Analgesic (Safety coated) Arthralgen Diffunasal Mefanamic Suprofen  Arthritis Strength Bufferin Dihydrocodeine Mepro Compound Suprol  Arthropan liquid Dopirydamole Methcarbomol with Aspirin Synalgos  ASA tablets/Enseals Disalcid Micrainin Tagament  Ascriptin Doan's Midol Talwin  Ascriptin A/D Dolene Mobidin Tanderil  Ascriptin Extra Strength Dolobid Moblgesic Ticlid  Ascriptin with Codeine Doloprin or Doloprin with Codeine Momentum Tolectin  Asperbuf Duoprin Mono-gesic Trendar  Aspergum Duradyne Motrin or Motrin IB Triminicin  Aspirin plain, buffered or enteric coated Durasal Myochrisine Trigesic  Aspirin Suppositories Easprin Nalfon Trillsate  Aspirin with Codeine Ecotrin Regular or Extra Strength Naprosyn Uracel  Atromid-S Efficin Naproxen Ursinus  Auranofin Capsules Elmiron Neocylate Vanquish  Axotal Emagrin Norgesic Verin  Azathioprine Empirin or Empirin with Codeine Normiflo Vitamin E  Azolid Emprazil Nuprin Voltaren  Bayer Aspirin plain, buffered or children's or timed BC Tablets or powders  Encaprin Orgaran Warfarin Sodium  Buff-a-Comp Enoxaparin Orudis Zorpin  Buff-a-Comp with Codeine Equegesic Os-Cal-Gesic   Buffaprin Excedrin plain, buffered or Extra Strength Oxalid   Bufferin Arthritis Strength Feldene Oxphenbutazone   Bufferin plain or Extra Strength Feldene Capsules Oxycodone with Aspirin   Bufferin with Codeine Fenoprofen Fenoprofen Pabalate or Pabalate-SF   Buffets II Flogesic Panagesic   Buffinol plain or Extra Strength Florinal or Florinal with Codeine Panwarfarin   Buf-Tabs Flurbiprofen Penicillamine   Butalbital Compound Four-way cold tablets Penicillin   Butazolidin Fragmin Pepto-Bismol   Carbenicillin Geminisyn Percodan   Carna Arthritis Reliever Geopen Persantine   Carprofen Gold's salt Persistin   Chloramphenicol Goody's Phenylbutazone   Chloromycetin Haltrain Piroxlcam   Clmetidine heparin Plaquenil   Cllnoril Hyco-pap Ponstel   Clofibrate Hydroxy chloroquine Propoxyphen         Before stopping any of these medications, be sure to consult the physician who ordered them.  Some, such as Coumadin (Warfarin) are ordered to prevent or treat serious conditions such as "deep thrombosis", "pumonary embolisms", and other heart problems.  The amount of time that you may need off of the medication may also vary with the medication and the reason for which you were taking it.  If you are taking any of these medications, please make sure you notify your pain physician before you undergo any procedures.         Trigger Point Injection Trigger points are areas where you have muscle pain. A trigger point injection is a shot given in the trigger point to relieve that pain. A trigger point might feel like a knot in your muscle. It hurts to press on a trigger point. Sometimes the pain spreads out (radiates) to other parts of the body. For example, pressing on a trigger point in your shoulder might cause pain in your arm or neck. You might have one trigger point. Or, you  might have more than one. People often have trigger points in their upper back and lower back. They also occur often in the neck and shoulders. Pain from a trigger point lasts for a long time. It can make it hard to keep moving. You might not be able to do the exercise or physical therapy that could help you deal with the pain. A trigger point injection may help. It does not work for everyone. But, it may relieve your pain for a few days or a few months. A trigger point injection does not cure long-lasting (chronic) pain. LET YOUR CAREGIVER KNOW  ABOUT:  Any allergies (especially to latex, lidocaine, or steroids).  Blood-thinning medicines that you take. These drugs can lead to bleeding or bruising after an injection. They include:  Aspirin.  Ibuprofen.  Clopidogrel.  Warfarin.  Other medicines you take. This includes all vitamins, herbs, eyedrops, over-the-counter medicines, and creams.  Use of steroids.  Recent infections.  Past problems with numbing medicines.  Bleeding problems.  Surgeries you have had.  Other health problems. RISKS AND COMPLICATIONS A trigger point injection is a safe treatment. However, problems may develop, such as:  Minor side effects usually go away in 1 to 2 days. These may include:  Soreness.  Bruising.  Stiffness.  More serious problems are rare. But, they may include:  Bleeding under the skin (hematoma).  Skin infection.  Breaking off of the needle under your skin.  Lung puncture.  The trigger point injection may not work for you. BEFORE THE PROCEDURE You may need to stop taking any medicine that thins your blood. This is to prevent bleeding and bruising. Usually these medicines are stopped several days before the injection. No other preparation is needed. PROCEDURE  A trigger point injection can be given in your caregiver's office or in a clinic. Each injection takes 2 minutes or less.  Your caregiver will feel for trigger points.  The caregiver may use a marker to circle the area for the injection.  The skin over the trigger point will be washed with a germ-killing (antiseptic) solution.  The caregiver pinches the spot for the injection.  Then, a very thin needle is used for the shot. You may feel pain or a twitching feeling when the needle enters the trigger point.  A numbing solution may be injected into the trigger point. Sometimes a drug to keep down swelling, redness, and warmth (inflammation) is also injected.  Your caregiver moves the needle around the trigger zone until the tightness and twitching goes away.  After the injection, your caregiver may put gentle pressure over the injection site.  Then it is covered with a bandage. AFTER THE PROCEDURE  You can go right home after the injection.  The bandage can be taken off after a few hours.  You may feel sore and stiff for 1 to 2 days.  Go back to your regular activities slowly. Your caregiver may ask you to stretch your muscles. Do not do anything that takes extra energy for a few days.  Follow your caregiver's instructions to manage and treat other pain.   This information is not intended to replace advice given to you by your health care provider. Make sure you discuss any questions you have with your health care provider.   Document Released: 03/30/2011 Document Revised: 08/05/2012 Document Reviewed: 03/30/2011 Elsevier Interactive Patient Education [employer].

## 2016-02-10 NOTE — Patient Instructions (Signed)
GENERAL RISKS AND COMPLICATIONS  What are the risk, side effects and possible complications? Generally speaking, most procedures are safe.  However, with any procedure there are risks, side effects, and the possibility of complications.  The risks and complications are dependent upon the sites that are lesioned, or the type of nerve block to be performed.  The closer the procedure is to the spine, the more serious the risks are.  Great care is taken when placing the radio frequency needles, block needles or lesioning probes, but sometimes complications can occur. 1. Infection: Any time there is an injection through the skin, there is a risk of infection.  This is why sterile conditions are used for these blocks.  There are four possible types of infection. 1. Localized skin infection. 2. Central Nervous System Infection-This can be in the form of Meningitis, which can be deadly. 3. Epidural Infections-This can be in the form of an epidural abscess, which can cause pressure inside of the spine, causing compression of the spinal cord with subsequent paralysis. This would require an emergency surgery to decompress, and there are no guarantees that the patient would recover from the paralysis. 4. Discitis-This is an infection of the intervertebral discs.  It occurs in about 1% of discography procedures.  It is difficult to treat and it may lead to surgery.        2. Pain: the needles have to go through skin and soft tissues, will cause soreness.       3. Damage to internal structures:  The nerves to be lesioned may be near blood vessels or    other nerves which can be potentially damaged.       4. Bleeding: Bleeding is more common if the patient is taking blood thinners such as  aspirin, Coumadin, Ticiid, Plavix, etc., or if he/she have some genetic predisposition  such as hemophilia. Bleeding into the spinal canal can cause compression of the spinal  cord with subsequent paralysis.  This would require an  emergency surgery to  decompress and there are no guarantees that the patient would recover from the  paralysis.       5. Pneumothorax:  Puncturing of a lung is a possibility, every time a needle is introduced in  the area of the chest or upper back.  Pneumothorax refers to free air around the  collapsed lung(s), inside of the thoracic cavity (chest cavity).  Another two possible  complications related to a similar event would include: Hemothorax and Chylothorax.   These are variations of the Pneumothorax, where instead of air around the collapsed  lung(s), you may have blood or chyle, respectively.       6. Spinal headaches: They may occur with any procedures in the area of the spine.       7. Persistent CSF (Cerebro-Spinal Fluid) leakage: This is a rare problem, but may occur  with prolonged intrathecal or epidural catheters either due to the formation of a fistulous  track or a dural tear.       8. Nerve damage: By working so close to the spinal cord, there is always a possibility of  nerve damage, which could be as serious as a permanent spinal cord injury with  paralysis.       9. Death:  Although rare, severe deadly allergic reactions known as "Anaphylactic  reaction" can occur to any of the medications used.      10. Worsening of the symptoms:  We can always make thing worse.    What are the chances of something like this happening? Chances of any of this occuring are extremely low.  By statistics, you have more of a chance of getting killed in a motor vehicle accident: while driving to the hospital than any of the above occurring .  Nevertheless, you should be aware that they are possibilities.  In general, it is similar to taking a shower.  Everybody knows that you can slip, hit your head and get killed.  Does that mean that you should not shower again?  Nevertheless always keep in mind that statistics do not mean anything if you happen to be on the wrong side of them.  Even if a procedure has a 1  (one) in a 1,000,000 (million) chance of going wrong, it you happen to be that one..Also, keep in mind that by statistics, you have more of a chance of having something go wrong when taking medications.  Who should not have this procedure? If you are on a blood thinning medication (e.g. Coumadin, Plavix, see list of "Blood Thinners"), or if you have an active infection going on, you should not have the procedure.  If you are taking any blood thinners, please inform your physician.  How should I prepare for this procedure?  Do not eat or drink anything at least six hours prior to the procedure.  Bring a driver with you .  It cannot be a taxi.  Come accompanied by an adult that can drive you back, and that is strong enough to help you if your legs get weak or numb from the local anesthetic.  Take all of your medicines the morning of the procedure with just enough water to swallow them.  If you have diabetes, make sure that you are scheduled to have your procedure done first thing in the morning, whenever possible.  If you have diabetes, take only half of your insulin dose and notify our nurse that you have done so as soon as you arrive at the clinic.  If you are diabetic, but only take blood sugar pills (oral hypoglycemic), then do not take them on the morning of your procedure.  You may take them after you have had the procedure.  Do not take aspirin or any aspirin-containing medications, at least eleven (11) days prior to the procedure.  They may prolong bleeding.  Wear loose fitting clothing that may be easy to take off and that you would not mind if it got stained with Betadine or blood.  Do not wear any jewelry or perfume  Remove any nail coloring.  It will interfere with some of our monitoring equipment.  NOTE: Remember that this is not meant to be interpreted as a complete list of all possible complications.  Unforeseen problems may occur.  BLOOD THINNERS The following drugs  contain aspirin or other products, which can cause increased bleeding during surgery and should not be taken for 2 weeks prior to and 1 week after surgery.  If you should need take something for relief of minor pain, you may take acetaminophen which is found in Tylenol,m Datril, Anacin-3 and Panadol. It is not blood thinner. The products listed below are.  Do not take any of the products listed below in addition to any listed on your instruction sheet.  A.P.C or A.P.C with Codeine Codeine Phosphate Capsules #3 Ibuprofen Ridaura  ABC compound Congesprin Imuran rimadil  Advil Cope Indocin Robaxisal  Alka-Seltzer Effervescent Pain Reliever and Antacid Coricidin or Coricidin-D  Indomethacin Rufen    Alka-Seltzer plus Cold Medicine Cosprin Ketoprofen S-A-C Tablets  Anacin Analgesic Tablets or Capsules Coumadin Korlgesic Salflex  Anacin Extra Strength Analgesic tablets or capsules CP-2 Tablets Lanoril Salicylate  Anaprox Cuprimine Capsules Levenox Salocol  Anexsia-D Dalteparin Magan Salsalate  Anodynos Darvon compound Magnesium Salicylate Sine-off  Ansaid Dasin Capsules Magsal Sodium Salicylate  Anturane Depen Capsules Marnal Soma  APF Arthritis pain formula Dewitt's Pills Measurin Stanback  Argesic Dia-Gesic Meclofenamic Sulfinpyrazone  Arthritis Bayer Timed Release Aspirin Diclofenac Meclomen Sulindac  Arthritis pain formula Anacin Dicumarol Medipren Supac  Analgesic (Safety coated) Arthralgen Diffunasal Mefanamic Suprofen  Arthritis Strength Bufferin Dihydrocodeine Mepro Compound Suprol  Arthropan liquid Dopirydamole Methcarbomol with Aspirin Synalgos  ASA tablets/Enseals Disalcid Micrainin Tagament  Ascriptin Doan's Midol Talwin  Ascriptin A/D Dolene Mobidin Tanderil  Ascriptin Extra Strength Dolobid Moblgesic Ticlid  Ascriptin with Codeine Doloprin or Doloprin with Codeine Momentum Tolectin  Asperbuf Duoprin Mono-gesic Trendar  Aspergum Duradyne Motrin or Motrin IB Triminicin  Aspirin  plain, buffered or enteric coated Durasal Myochrisine Trigesic  Aspirin Suppositories Easprin Nalfon Trillsate  Aspirin with Codeine Ecotrin Regular or Extra Strength Naprosyn Uracel  Atromid-S Efficin Naproxen Ursinus  Auranofin Capsules Elmiron Neocylate Vanquish  Axotal Emagrin Norgesic Verin  Azathioprine Empirin or Empirin with Codeine Normiflo Vitamin E  Azolid Emprazil Nuprin Voltaren  Bayer Aspirin plain, buffered or children's or timed BC Tablets or powders Encaprin Orgaran Warfarin Sodium  Buff-a-Comp Enoxaparin Orudis Zorpin  Buff-a-Comp with Codeine Equegesic Os-Cal-Gesic   Buffaprin Excedrin plain, buffered or Extra Strength Oxalid   Bufferin Arthritis Strength Feldene Oxphenbutazone   Bufferin plain or Extra Strength Feldene Capsules Oxycodone with Aspirin   Bufferin with Codeine Fenoprofen Fenoprofen Pabalate or Pabalate-SF   Buffets II Flogesic Panagesic   Buffinol plain or Extra Strength Florinal or Florinal with Codeine Panwarfarin   Buf-Tabs Flurbiprofen Penicillamine   Butalbital Compound Four-way cold tablets Penicillin   Butazolidin Fragmin Pepto-Bismol   Carbenicillin Geminisyn Percodan   Carna Arthritis Reliever Geopen Persantine   Carprofen Gold's salt Persistin   Chloramphenicol Goody's Phenylbutazone   Chloromycetin Haltrain Piroxlcam   Clmetidine heparin Plaquenil   Cllnoril Hyco-pap Ponstel   Clofibrate Hydroxy chloroquine Propoxyphen         Before stopping any of these medications, be sure to consult the physician who ordered them.  Some, such as Coumadin (Warfarin) are ordered to prevent or treat serious conditions such as "deep thrombosis", "pumonary embolisms", and other heart problems.  The amount of time that you may need off of the medication may also vary with the medication and the reason for which you were taking it.  If you are taking any of these medications, please make sure you notify your pain physician before you undergo any  procedures.         Trigger Point Injection Trigger points are areas where you have muscle pain. A trigger point injection is a shot given in the trigger point to relieve that pain. A trigger point might feel like a knot in your muscle. It hurts to press on a trigger point. Sometimes the pain spreads out (radiates) to other parts of the body. For example, pressing on a trigger point in your shoulder might cause pain in your arm or neck. You might have one trigger point. Or, you might have more than one. People often have trigger points in their upper back and lower back. They also occur often in the neck and shoulders. Pain from a trigger point lasts for a long time. It can make  it hard to keep moving. You might not be able to do the exercise or physical therapy that could help you deal with the pain. A trigger point injection may help. It does not work for everyone. But, it may relieve your pain for a few days or a few months. A trigger point injection does not cure long-lasting (chronic) pain. LET YOUR CAREGIVER KNOW ABOUT:  Any allergies (especially to latex, lidocaine, or steroids).  Blood-thinning medicines that you take. These drugs can lead to bleeding or bruising after an injection. They include:  Aspirin.  Ibuprofen.  Clopidogrel.  Warfarin.  Other medicines you take. This includes all vitamins, herbs, eyedrops, over-the-counter medicines, and creams.  Use of steroids.  Recent infections.  Past problems with numbing medicines.  Bleeding problems.  Surgeries you have had.  Other health problems. RISKS AND COMPLICATIONS A trigger point injection is a safe treatment. However, problems may develop, such as:  Minor side effects usually go away in 1 to 2 days. These may include:  Soreness.  Bruising.  Stiffness.  More serious problems are rare. But, they may include:  Bleeding under the skin (hematoma).  Skin infection.  Breaking off of the needle under your  skin.  Lung puncture.  The trigger point injection may not work for you. BEFORE THE PROCEDURE You may need to stop taking any medicine that thins your blood. This is to prevent bleeding and bruising. Usually these medicines are stopped several days before the injection. No other preparation is needed. PROCEDURE  A trigger point injection can be given in your caregiver's office or in a clinic. Each injection takes 2 minutes or less.  Your caregiver will feel for trigger points. The caregiver may use a marker to circle the area for the injection.  The skin over the trigger point will be washed with a germ-killing (antiseptic) solution.  The caregiver pinches the spot for the injection.  Then, a very thin needle is used for the shot. You may feel pain or a twitching feeling when the needle enters the trigger point.  A numbing solution may be injected into the trigger point. Sometimes a drug to keep down swelling, redness, and warmth (inflammation) is also injected.  Your caregiver moves the needle around the trigger zone until the tightness and twitching goes away.  After the injection, your caregiver may put gentle pressure over the injection site.  Then it is covered with a bandage. AFTER THE PROCEDURE  You can go right home after the injection.  The bandage can be taken off after a few hours.  You may feel sore and stiff for 1 to 2 days.  Go back to your regular activities slowly. Your caregiver may ask you to stretch your muscles. Do not do anything that takes extra energy for a few days.  Follow your caregiver's instructions to manage and treat other pain.   This information is not intended to replace advice given to you by your health care provider. Make sure you discuss any questions you have with your health care provider.   Document Released: 03/30/2011 Document Revised: 08/05/2012 Document Reviewed: 03/30/2011 Elsevier Interactive Patient Education [employer].

## 2016-02-10 NOTE — Progress Notes (Addendum)
Nursing Pain Medication Assessment:  Safety precautions to be maintained throughout the outpatient stay will include: orient to surroundings, keep bed in low position, maintain call bell within reach at all times, provide assistance with transfer out of bed and ambulation.  Medication Inspection Compliance: Pill count conducted under aseptic conditions, in front of the patient. Neither the pills nor the bottle was removed from the patient's sight at any time. Once count was completed pills were immediately returned to the patient in their original bottle. Pill Count: 24 of 90 pills remain Bottle Appearance: Standard pharmacy container. Clearly labeled. Medication: Methadone 10 mg Filled Date: 09 / 30 / 2017   Medication Inspection Compliance: Pill count conducted under aseptic conditions, in front of the patient. Neither the pills nor the bottle was removed from the patient's sight at any time. Once count was completed pills were immediately returned to the patient in their original bottle. Pill Count: 35 of 28 pills remain Bottle Appearance: Standard pharmacy container. Clearly labeled. Medication: Methadone 5mg  Filled Date: 31 / 16 / 2017  Medication Inspection Compliance: Pill count conducted under aseptic conditions, in front of the patient. Neither the pills nor the bottle was removed from the patient's sight at any time. Once count was completed pills were immediately returned to the patient in their original bottle. Pill Count: 39 of 60 pills remain Bottle Appearance: Standard pharmacy container. Clearly labeled. Medication: Lyrica Filled Date: 09 / 30 / 2017

## 2016-02-10 NOTE — Telephone Encounter (Signed)
Patient notified that a prescription was left here and he could come and get it at the front desk.

## 2016-02-15 ENCOUNTER — Encounter: Payer: Self-pay | Admitting: Pain Medicine

## 2016-02-15 ENCOUNTER — Ambulatory Visit: Payer: Medicare Other | Attending: Pain Medicine | Admitting: Pain Medicine

## 2016-02-15 VITALS — BP 169/65 | HR 48 | Temp 98.3°F | Resp 18 | Ht 70.0 in | Wt 200.0 lb

## 2016-02-15 DIAGNOSIS — M79641 Pain in right hand: Secondary | ICD-10-CM | POA: Diagnosis present

## 2016-02-15 DIAGNOSIS — G5603 Carpal tunnel syndrome, bilateral upper limbs: Secondary | ICD-10-CM

## 2016-02-15 MED ORDER — METHYLPREDNISOLONE ACETATE 80 MG/ML IJ SUSP
80.0000 mg | Freq: Once | INTRAMUSCULAR | Status: AC
  Administered 2016-02-15: 80 mg
  Filled 2016-02-15: qty 1

## 2016-02-15 MED ORDER — LIDOCAINE HCL (PF) 1 % IJ SOLN
5.0000 mL | Freq: Once | INTRAMUSCULAR | Status: AC
  Administered 2016-02-15: 5 mL
  Filled 2016-02-15: qty 5

## 2016-02-15 MED ORDER — METHYLPREDNISOLONE ACETATE 80 MG/ML IJ SUSP
INTRAMUSCULAR | Status: AC
  Filled 2016-02-15: qty 1

## 2016-02-15 MED ORDER — ROPIVACAINE HCL 2 MG/ML IJ SOLN
1.0000 mL | Freq: Once | INTRAMUSCULAR | Status: AC
  Administered 2016-02-15: 1 mL
  Filled 2016-02-15: qty 10

## 2016-02-15 NOTE — Progress Notes (Signed)
Patient's Name: Craig Alvarado  MRN: [id number]  Referring Provider: Fritzi Mandes, MD  DOB: 18-Aug-1945  PCP: Fritzi Mandes, MD  DOS: 02/15/2016  Note by: Kathlen Brunswick. Dossie Arbour, MD  Service setting: Ambulatory outpatient  Location: [facility] (AMB) Pain Management Facility  Visit type: Procedure  Specialty: Interventional Pain Management  Patient type: Established   Primary Reason for Visit: Interventional Pain Management Treatment. CC: Hand Pain (carpel tunnel pain bilaterally)  Procedure:  Anesthesia, Analgesia, Anxiolysis:  Type: Carpal Tunnel Injection (Median Nerve Block) Primary Purpose: Diagnostic Level: Between the middle and distal wrist creases Region: Palmar aspect of the wrist Laterality: Bilateral Position: Sitting with forearm in supine position over mayo tray/table Target Area: Space parallel to median nerve on ulnar side of carpal tunnel. Approach: Parallel to the palmaris longus tendon, on the ulnar side  Type: Local Anesthesia Local Anesthetic: Lidocaine 1% Route: Infiltration ([place]/IM) IV Access: Declined Sedation: Declined  Indication(s): Analgesia          Indications: 1. Bilateral carpal tunnel syndrome    Pain Score: Pre-procedure: 4 /10 Post-procedure: 1  (1 on right- left numb)/10  Pre-Procedure Assessment:  [name] is a 70 y.o. (year old), male patient, seen today for interventional treatment. He  has a past surgical history that includes Spine surgery.. His primarily concern today is the Hand Pain (carpel tunnel pain bilaterally) The encounter diagnosis was Bilateral carpal tunnel syndrome.  Pain Descriptors / Indicators: Constant (stinging) Pain Frequency: Constant  Date of Last Visit: 02/10/16 Service Provided on Last Visit: Med Refill, Evaluation  Coagulation Parameters No results found for: INR, LABPROT, APTT, PLT Verification of the correct person, correct site (including marking of site), and correct procedure were performed and confirmed by  the patient.  Consent: Before the procedure and under the influence of no sedative(s), amnesic(s), or anxiolytics, the patient was informed of the treatment options, risks and possible complications. To fulfill our ethical and legal obligations, as recommended by the American Medical Association's Code of Ethics, I have informed the patient of my clinical impression; the nature and purpose of the treatment or procedure; the risks, benefits, and possible complications of the intervention; the alternatives, including doing nothing; the risk(s) and benefit(s) of the alternative treatment(s) or procedure(s); and the risk(s) and benefit(s) of doing nothing. The patient was provided information about the general risks and possible complications associated with the procedure. These may include, but are not limited to: failure to achieve desired goals, infection, bleeding, organ or nerve damage, allergic reactions, paralysis, and death. In addition, the patient was informed of those risks and complications associated to the procedure, such as failure to decrease pain; infection; bleeding; organ or nerve damage with subsequent damage to sensory, motor, and/or autonomic systems, resulting in permanent pain, numbness, and/or weakness of one or several areas of the body; allergic reactions; (i.e.: anaphylactic reaction); and/or death. Furthermore, the patient was informed of those risks and complications associated with the medications. These include, but are not limited to: allergic reactions (i.e.: anaphylactic or anaphylactoid reaction(s)); adrenal axis suppression; blood sugar elevation that in diabetics may result in ketoacidosis or comma; water retention that in patients with history of congestive heart failure may result in shortness of breath, pulmonary edema, and decompensation with resultant heart failure; weight gain; swelling or edema; medication-induced neural toxicity; particulate matter embolism and blood  vessel occlusion with resultant organ, and/or nervous system infarction; and/or aseptic necrosis of one or more joints. Finally, the patient was informed that Medicine is not an  exact science; therefore, there is also the possibility of unforeseen or unpredictable risks and/or possible complications that may result in a catastrophic outcome. The patient indicated having understood very clearly. We have given the patient no guarantees and we have made no promises. Enough time was given to the patient to ask questions, all of which were answered to the patient's satisfaction. [name] has indicated that he wanted to continue with the procedure.  Consent Attestation: I, the ordering provider, attest that I have discussed with the patient the benefits, risks, side-effects, alternatives, likelihood of achieving goals, and potential problems during recovery for the procedure that I have provided informed consent.  Pre-Procedure Preparation:  Safety Precautions: Allergies reviewed. The patient was asked about blood thinners, or active infections, both of which were denied. The patient was asked to confirm the procedure and laterality, before marking the site, and again before commencing the procedure. Appropriate site, procedure, and patient were confirmed by following the Joint Commission's Universal Protocol (UP.01.01.01), in the form of a "Time Out". The patient was asked to participate by confirming the accuracy of the "Time Out" information. Patient was assessed for positional comfort and pressure points before starting the procedure. Allergies: He has No Known Allergies.. Allergy Precautions: None required Infection Control Precautions: Sterile technique used. Standard Universal Precautions were taken as recommended by the Department of [facility] for Disease Control and Prevention (CDC). Standard pre-surgical skin prep was conducted. Respiratory hygiene and cough etiquette was practiced. Hand  hygiene observed. Safe injection practices and needle disposal techniques followed. SDV (single dose vial) medications used. Medications properly checked for expiration dates and contaminants. Personal protective equipment (PPE) used as per protocol. Monitoring:  As per clinic protocol. Vitals:   02/15/16 1027 02/15/16 1122 02/15/16 1130  BP: 124/74 (!) 174/86 (!) 169/65  Pulse: (!) 41 (!) 50 (!) 48  Resp: 16 16 18   Temp: 98.3 F (36.8 C)    TempSrc: Oral    SpO2: 99%  98%  Weight: 200 lb (90.7 kg)    Height: 5\' 10"  (1.778 m)    Calculated BMI: Body mass index is 28.7 kg/m. Time-out: "Time-out" completed before starting procedure, as per protocol.  Description of Procedure Process:  Area Prepped: Entire palmar aspect of the forearm, wrist, and hand. Prepping solution: ChloraPrep (2% chlorhexidine gluconate and 70% isopropyl alcohol) Procedural Technique Safety Precautions: Aspiration looking for blood return was conducted prior to all injections. At no point did we inject any substances, as a needle was being advanced. No attempts were made at seeking any paresthesias. Safe injection practices and needle disposal techniques used. Medications properly checked for expiration dates. SDV (single dose vial) medications used. Description of the Procedure: Protocol guidelines were followed. The patient was assisted into a comfortable position. The target area was identified and the area prepped in the usual manner. Skin & deeper tissues infiltrated with local anesthetic. Appropriate amount of time allowed to pass for local anesthetics to take effect. The procedure needles were then advanced to the target area. Proper needle placement secured. Negative aspiration confirmed. Solution injected in intermittent fashion, asking for systemic symptoms every 0.5cc of injectate. The needles were then removed and the area cleansed, making sure to leave some of the prepping solution back to take advantage of its  long term bactericidal properties. EBL: None Materials & Medications:  Needle(s) Type: Regular needle Gauge: 25G Length: 0.5-in Medication(s): We administered ropivacaine (PF) 2 mg/ml (0.2%), methylPREDNISolone acetate, and lidocaine (PF). Please see chart orders for  dosing details.  Imaging Guidance:  Type of Imaging Technique: None used Indication(s): N/A Exposure Time: No patient exposure Contrast: None used. Fluoroscopic Guidance: N/A Ultrasound Guidance: N/A Interpretation: N/A  Antibiotic Prophylaxis:  Indication(s): No indications identified. Type:  Antibiotics Given (last 72 hours)    None      Post-operative Assessment:  Complications: No immediate post-treatment complications observed by team, or reported by patient. Disposition: The patient tolerated the entire procedure well. A repeat set of vitals were taken after the procedure and the patient was kept under observation following institutional policy, for this type of procedure. Post-procedural neurological assessment was performed, showing return to baseline, prior to discharge. The patient was provided with post-procedure discharge instructions, including a section on how to identify potential problems. Should any problems arise concerning this procedure, the patient was given instructions to immediately contact us, at any time, without hesitation. In any case, we plan to contact the patient by telephone for a follow-up status report regarding this interventional procedure. Comments:  No additional relevant information.  Plan of Care  Discharge to: Discharge home  Medications ordered for procedure: Meds ordered this encounter  Medications  . ropivacaine (PF) 2 mg/ml (0.2%) (NAROPIN) epidural 1 mL  . methylPREDNISolone acetate (DEPO-MEDROL) injection 80 mg  . lidocaine (PF) (XYLOCAINE) 1 % injection 5 mL  . methylPREDNISolone acetate (DEPO-MEDROL) 80 MG/ML injection    COCHRAN, HANNAH: cabinet override    Medications administered: (For more details, see medical record) We administered ropivacaine (PF) 2 mg/ml (0.2%), methylPREDNISolone acetate, and lidocaine (PF).  Imaging Ordered: No results found for this or any previous visit. New Prescriptions   No medications on file   Physician-requested Follow-up:  Return in about 2 weeks (around 02/29/2016) for Post-Procedure evaluation.  Future Appointments Date Time Provider [place]  03/21/2016 11:45 AM Milinda Pointer, MD [facility] None  05/18/2016 10:00 AM Milinda Pointer, MD [facility] None   Primary Care Physician: Fritzi Mandes, MD Location: [facility] Out[facility] Facility Note by: Kathlen Brunswick Dossie Arbour, M.D, DABA, DABAPM, DABPM, DABIPP, FIPP  Disclaimer:  Medicine is not an exact science. The only guarantee in medicine is that nothing is guaranteed. It is important to note that the decision to proceed with this intervention was based on the information collected from the patient. The Data and conclusions were drawn from the patient's questionnaire, the interview, and the physical examination. Because the information was provided in large part by the patient, it cannot be guaranteed that it has not been purposely or unconsciously manipulated. Every effort has been made to obtain as much relevant data as possible for this evaluation. It is important to note that the conclusions that lead to this procedure are derived in large part from the available data. Always take into account that the treatment will also be dependent on availability of resources and existing treatment guidelines, considered by other Pain Management Practitioners as being common knowledge and practice, at the time of the intervention. For Medico-Legal purposes, it is also important to point out that variation in procedural techniques and pharmacological choices are the acceptable norm. The indications, contraindications, technique, and results of the  above procedure should only be interpreted and judged by a Board-Certified Interventional Pain Specialist with extensive familiarity and expertise in the same exact procedure and technique. Attempts at providing opinions without similar or greater experience and expertise than that of the treating physician will be considered as inappropriate and unethical, and shall result in a formal complaint to the state medical  board and applicable specialty societies.  Instructions provided at this appointment: There are no Patient Instructions on file for this visit.

## 2016-02-15 NOTE — Progress Notes (Signed)
Safety precautions to be maintained throughout the outpatient stay will include: orient to surroundings, keep bed in low position, maintain call bell within reach at all times, provide assistance with transfer out of bed and ambulation.  

## 2016-02-16 NOTE — Telephone Encounter (Signed)
Left voicemail for patient to call our office if there are questions or concerns re; procedure on yesterday.  

## 2016-02-23 ENCOUNTER — Telehealth: Payer: Self-pay | Admitting: Pain Medicine

## 2016-02-23 NOTE — Telephone Encounter (Signed)
Please call regarding medications / unable to get meds, please call patient

## 2016-02-23 NOTE — Telephone Encounter (Signed)
Voicemail left with patient that problem with medication fill could be that he is trying to fill to soon as MR is 02/25/16 on titration schedule.  Also, there is an issue with Dr Dossie Arbour prescribing and he is aware of this and we are addressing the situation.

## 2016-02-24 ENCOUNTER — Telehealth: Payer: Self-pay | Admitting: *Deleted

## 2016-02-25 NOTE — Telephone Encounter (Signed)
Attempted to call patient, message left. 

## 2016-02-25 NOTE — Telephone Encounter (Signed)
Patient advised to fill and take meds as directed.

## 2016-03-02 DIAGNOSIS — R5383 Other fatigue: Secondary | ICD-10-CM | POA: Insufficient documentation

## 2016-03-21 ENCOUNTER — Other Ambulatory Visit
Admission: RE | Admit: 2016-03-21 | Discharge: 2016-03-21 | Disposition: A | Discharge status: Home / Self Care | Payer: Medicare Other | Source: Ambulatory Visit | Attending: Pain Medicine | Admitting: Pain Medicine

## 2016-03-21 ENCOUNTER — Ambulatory Visit
Admission: RE | Admit: 2016-03-21 | Discharge: 2016-03-21 | Disposition: A | Discharge status: Home / Self Care | Payer: Medicare Other | Source: Ambulatory Visit | Attending: Pain Medicine | Admitting: Pain Medicine

## 2016-03-21 ENCOUNTER — Encounter: Payer: Self-pay | Admitting: Pain Medicine

## 2016-03-21 ENCOUNTER — Ambulatory Visit: Payer: Medicare Other | Attending: Pain Medicine | Admitting: Pain Medicine

## 2016-03-21 VITALS — BP 128/44 | HR 50 | Temp 97.8°F | Resp 16 | Ht 70.0 in | Wt 200.0 lb

## 2016-03-21 DIAGNOSIS — M4802 Spinal stenosis, cervical region: Secondary | ICD-10-CM

## 2016-03-21 DIAGNOSIS — F1290 Cannabis use, unspecified, uncomplicated: Secondary | ICD-10-CM | POA: Diagnosis not present

## 2016-03-21 DIAGNOSIS — M79601 Pain in right arm: Secondary | ICD-10-CM

## 2016-03-21 DIAGNOSIS — G894 Chronic pain syndrome: Secondary | ICD-10-CM

## 2016-03-21 DIAGNOSIS — F1120 Opioid dependence, uncomplicated: Secondary | ICD-10-CM | POA: Diagnosis not present

## 2016-03-21 DIAGNOSIS — G8929 Other chronic pain: Secondary | ICD-10-CM

## 2016-03-21 DIAGNOSIS — I10 Essential (primary) hypertension: Secondary | ICD-10-CM | POA: Diagnosis not present

## 2016-03-21 DIAGNOSIS — K219 Gastro-esophageal reflux disease without esophagitis: Secondary | ICD-10-CM | POA: Insufficient documentation

## 2016-03-21 DIAGNOSIS — Z79899 Other long term (current) drug therapy: Secondary | ICD-10-CM | POA: Diagnosis not present

## 2016-03-21 DIAGNOSIS — G5600 Carpal tunnel syndrome, unspecified upper limb: Secondary | ICD-10-CM

## 2016-03-21 DIAGNOSIS — Z87891 Personal history of nicotine dependence: Secondary | ICD-10-CM | POA: Diagnosis not present

## 2016-03-21 DIAGNOSIS — G5603 Carpal tunnel syndrome, bilateral upper limbs: Secondary | ICD-10-CM | POA: Diagnosis not present

## 2016-03-21 DIAGNOSIS — M5412 Radiculopathy, cervical region: Secondary | ICD-10-CM | POA: Insufficient documentation

## 2016-03-21 DIAGNOSIS — E291 Testicular hypofunction: Secondary | ICD-10-CM | POA: Diagnosis not present

## 2016-03-21 DIAGNOSIS — M542 Cervicalgia: Secondary | ICD-10-CM

## 2016-03-21 DIAGNOSIS — Z981 Arthrodesis status: Secondary | ICD-10-CM | POA: Insufficient documentation

## 2016-03-21 DIAGNOSIS — Z5181 Encounter for therapeutic drug level monitoring: Secondary | ICD-10-CM | POA: Diagnosis not present

## 2016-03-21 DIAGNOSIS — Z7982 Long term (current) use of aspirin: Secondary | ICD-10-CM | POA: Diagnosis not present

## 2016-03-21 DIAGNOSIS — Z79891 Long term (current) use of opiate analgesic: Secondary | ICD-10-CM

## 2016-03-21 DIAGNOSIS — M79603 Pain in arm, unspecified: Secondary | ICD-10-CM | POA: Diagnosis present

## 2016-03-21 DIAGNOSIS — K5903 Drug induced constipation: Secondary | ICD-10-CM | POA: Diagnosis not present

## 2016-03-21 DIAGNOSIS — E559 Vitamin D deficiency, unspecified: Secondary | ICD-10-CM | POA: Insufficient documentation

## 2016-03-21 DIAGNOSIS — M25511 Pain in right shoulder: Secondary | ICD-10-CM

## 2016-03-21 DIAGNOSIS — T402X5A Adverse effect of other opioids, initial encounter: Secondary | ICD-10-CM | POA: Insufficient documentation

## 2016-03-21 DIAGNOSIS — E782 Mixed hyperlipidemia: Secondary | ICD-10-CM | POA: Diagnosis not present

## 2016-03-21 DIAGNOSIS — M62838 Other muscle spasm: Secondary | ICD-10-CM | POA: Insufficient documentation

## 2016-03-21 DIAGNOSIS — F1190 Opioid use, unspecified, uncomplicated: Secondary | ICD-10-CM

## 2016-03-21 LAB — COMPREHENSIVE METABOLIC PANEL
ALBUMIN: 4 g/dL (ref 3.5–5.0)
ALK PHOS: 56 U/L (ref 38–126)
ALT: 16 U/L — AB (ref 17–63)
ANION GAP: 6 (ref 5–15)
AST: 21 U/L (ref 15–41)
BILIRUBIN TOTAL: 0.5 mg/dL (ref 0.3–1.2)
BUN: 23 mg/dL — AB (ref 6–20)
CALCIUM: 8.7 mg/dL — AB (ref 8.9–10.3)
CO2: 25 mmol/L (ref 22–32)
CREATININE: 1.09 mg/dL (ref 0.61–1.24)
Chloride: 108 mmol/L (ref 101–111)
GFR calc Af Amer: >60 mL/min (ref >60)
GFR calc non Af Amer: >60 mL/min (ref >60)
GLUCOSE: 108 mg/dL — AB (ref 65–99)
Potassium: 4.1 mmol/L (ref 3.5–5.1)
Sodium: 139 mmol/L (ref 135–145)
TOTAL PROTEIN: 6.7 g/dL (ref 6.5–8.1)

## 2016-03-21 LAB — C-REACTIVE PROTEIN: CRP: <0.8 mg/dL (ref <1.0)

## 2016-03-21 LAB — MAGNESIUM: Magnesium: 2.2 mg/dL (ref 1.7–2.4)

## 2016-03-21 LAB — SEDIMENTATION RATE: Sed Rate: 13 mm/hr (ref 0–20)

## 2016-03-21 LAB — VITAMIN B12: Vitamin B-12: 658 pg/mL (ref 180–914)

## 2016-03-21 NOTE — Progress Notes (Signed)
Patient's Name: Craig Alvarado  MRN: [id number]  Referring Provider: Fritzi Mandes, MD  DOB: 1945/11/17  PCP: Fritzi Mandes, MD  DOS: 03/21/2016  Note by: Kathlen Brunswick. Dossie Arbour, MD  Service setting: Ambulatory outpatient  Specialty: Interventional Pain Management  Location: [facility] (AMB) Pain Management Facility    Patient type: Established   Primary Reason(s) for Visit: Encounter for prescription drug management & post-procedure evaluation of chronic illness with mild to moderate exacerbation(Level of risk: moderate) CC: Arm Pain (carpal tunnel syndrome, wrist down bilaterally)  HPI  Craig Alvarado is a 70 y.o. year old, male patient, who comes today for a post-procedure evaluation and medication management. 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[place]); Encounter for therapeutic drug level monitoring; Encounter for chronic pain management; Opioid dependence ([place]); Opioid-induced constipation ([place]); Chronic cervical radicular pain (Right); 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 His primarily concern today is the Arm Pain (carpal tunnel syndrome, wrist down bilaterally)  Pain  Assessment: Self-Reported Pain Score: 4 /10             Reported level is compatible with observation.       Pain Type: Chronic pain Pain Location: Wrist Pain Orientation: Left, Right Pain Descriptors / Indicators: Tingling, Numbness, Constant (feels like a beehive has been poked. ) Pain Frequency: Constant  Craig Alvarado was last seen on 02/23/2016 for a procedure. During today's appointment we reviewed Craig Alvarado post-procedure results, as well as his outpatient medication regimen. In reviewing the patient's  [place], I have noticed that somehow he managed to make sole the prescriptions that I had given him to slowly taper his medications down. He did not have them filled according to the instructions that I had written in the prescriptions. [place] [place] [facility] did not follow my instructions on when to refill each prescription. The patient has not filled 3 methadone prescriptions. (Methadone 5 mg #21 to be filled on 03/17/2016) (methadone 5 mg #14 to be filled on 03/24/2016) (methadone 5 mg #7 to be filled on 03/31/2016). In addition he has a prescription for oxycodone 10 mg #90 to be filled on 04/22/2016. He has enough medications to last until 05/22/2016. This case is rather frustrating since I have taken a significant amount of time to sit down with him and explaining great detail how he needed to take these medications. In addition, we also marked each prescription with a number so that he would know what order he needed to have them filled. As it turns out, the order that he used to fill the prescriptions was: 1 Lumbar 2, 3, 6, 5, 9, 10, 4, and 11. He has not filled #7, 8, 12, and 13.  In addition to this, the patient has managed not to have any of the lab work or x-rays that I have ordered. Today we will  reorder these and explained to the patient that if he does not complete those, we will not continue to see him.  Further details on both, my  assessment(s), as well as the proposed treatment plan, please see below.  Controlled Substance Pharmacotherapy Assessment REMS (Risk Evaluation and Mitigation Strategy)  Analgesic: Methadone 5 mg every 6 hours. MME/day: 80 mg/day.  No notes on file Pharmacokinetics: Liberation and absorption (onset of action): WNL Distribution (time to peak effect): WNL Metabolism and excretion (duration of action): WNL         Pharmacodynamics: Desired effects: Analgesia: Craig Alvarado reports >50% benefit. Functional ability: Patient reports that medication allows him to accomplish basic ADLs Clinically meaningful improvement in function (CMIF): Sustained CMIF goals met Perceived effectiveness: Described as relatively effective, allowing for increase in activities of daily living (ADL) Undesirable effects: Side-effects or Adverse reactions: None reported Monitoring:  PMP: Online review of the past 32-monthperiod conducted. Compliant with practice rules and regulations List of all UDS test(s) done:  Lab Results  Component Value Date   TOXASSSELUR FINAL 11/18/2015   TOXASSSELUR FINAL 09/16/2015   TOXASSSELUR FINAL 05/24/2015   TOXASSSELUR FINAL 03/29/2015   Last UDS on record: ToxAssure Select 13  Date Value Ref Range Status  11/18/2015 FINAL  Final    Comment:    ==================================================================== TOXASSURE SELECT 13 (MW) ==================================================================== Test                             Result       Flag       Units Drug Present and Declared for Prescription Verification   Methadone                      405          EXPECTED   ng/mg creat   EDDP (Methadone Mtb)           5466         EXPECTED   ng/mg creat    Sources of methadone include scheduled prescription medications.    EDDP is an expected metabolite of methadone. ==================================================================== Test                       Result    Flag   Units      Ref Range   Creatinine              120              mg/dL      >=20 ==================================================================== Declared Medications:  The flagging and interpretation on this report are based on the  following declared medications.  Unexpected results may arise from  inaccuracies in the declared medications.  **Note: The testing scope of this panel includes these medications:  Methadone (Dolophine)  **Note: The testing scope of this panel does not include following  reported medications:  Aspirin (Aspirin 81)  Atorvastatin (Lipitor)  Docusate (Colace)  Finasteride (Proscar)  Lubiprostone (Amitiza)  Magnesium (Mag-Ox)  Naloxone  Pregabalin (Lyrica)  Promethazine (Phenergan)  Sumatriptan (Imitrex)  Supplement  Tamsulosin (Flomax)  Vitamin D3 ==================================================================== For clinical consultation, please call ([phone] ====================================================================    UDS interpretation: Compliant          Medication Assessment Form: Reviewed. Patient indicates being compliant with therapy Treatment compliance: Compliant Risk Assessment Profile: Aberrant behavior: See prior evaluations. None observed or detected today Comorbid factors increasing risk of  overdose: See prior notes. No additional risks detected today Risk of substance use disorder (SUD): High-to-Very High Opioid Risk Tool (ORT) Total Score: 0  Interpretation Table:  Score <3 = Low Risk for SUD  Score between 4-7 = Moderate Risk for SUD  Score >8 = High Risk for Opioid Abuse   Risk Mitigation Strategies:  Patient Counseling: Covered Patient-Prescriber Agreement (PPA): Present and active  Notification to other healthcare providers: Done  Pharmacologic Plan: No change in therapy, at this time  Post-Procedure Assessment  02/23/2016 Procedure: Bilateral carpal tunnel block Influential  Factors: BMI: 28.70 kg/m Intra-procedural challenges: None observed Assessment challenges: None detected         Post-procedural side-effects, adverse reactions, or complications: None reported Reported issues: None  Sedation: Please see nurses note. When no sedatives are used, the analgesic levels obtained are directly associated to the effectiveness of the local anesthetics. However, when sedation is provided, the level of analgesia obtained during the initial 1 hour following the intervention, is believed to be the result of a combination of factors. These factors may include, but are not limited to: 1. The effectiveness of the local anesthetics used. 2. The effects of the analgesic(s) and/or anxiolytic(s) used. 3. The degree of discomfort experienced by the patient at the time of the procedure. 4. The patients ability and reliability in recalling and recording the events. 5. The presence and influence of possible secondary gains and/or psychosocial factors. Reported result: Relief experienced during the 1st hour after the procedure: 100 % (patient states he was numb x 1 hour and then pain returned. ) (Ultra-Short Term Relief) Interpretative annotation: Analgesia during this period is likely to be Local Anesthetic and/or IV Sedative (Analgesic/Anxiolitic) related.          Effects of local anesthetic: The analgesic effects attained during this period are directly associated to the localized infiltration of local anesthetics and therefore cary significant diagnostic value as to the etiological location, or anatomical origin, of the pain. Expected duration of relief is directly dependent on the pharmacodynamics of the local anesthetic used. Long-acting (4-6 hours) anesthetics used.  Reported result: Relief during the next 4 to 6 hour after the procedure: 0 % (Short-Term Relief) Interpretative annotation: Patient admits to poor recollection.          Long-term benefit: Defined as the period of  time past the expected duration of local anesthetics. With the possible exception of prolonged sympathetic blockade from the local anesthetics, benefits during this period are typically attributed to, or associated with, other factors such as analgesic sensory neuropraxia, antiinflammatory effects, or beneficial biochemical changes provided by agents other than the local anesthetics Reported result: Extended relief following procedure: 0 % (Long-Term Relief) Interpretative annotation: Patient admits to poor recollection.          Current benefits: Defined as persistent relief that continues at this point in time.   Reported results: Treated area: 0 %       Interpretative annotation: No benefit whatsoever. This would argue against repeating therapy  Interpretation: Results would suggest a neuropathy associated with permanent nerve damage, persistent neural entrapment, or mechanical compression/impingement, as opposed to an inflammatory-mediated neuropraxia          Laboratory Chemistry  Inflammation Markers Lab Results  Component Value Date   ESRSEDRATE 11 03/29/2015   CRP 0.5 03/29/2015   Renal Function Lab Results  Component Value Date   BUN 24 (H) 03/29/2015   CREATININE 0.89 03/29/2015   GFRAA >60 03/29/2015  GFRNONAA >60 03/29/2015   Hepatic Function Lab Results  Component Value Date   AST 26 03/29/2015   ALT 16 (L) 03/29/2015   ALBUMIN 3.9 03/29/2015   Electrolytes Lab Results  Component Value Date   NA 141 03/29/2015   K 4.6 03/29/2015   CL 107 03/29/2015   CALCIUM 9.0 03/29/2015   MG 2.2 03/29/2015   Pain Modulating Vitamins Lab Results  Component Value Date   25OHVITD1 24 (L) 03/29/2015   25OHVITD2 <1.0 03/29/2015   25OHVITD3 24 03/29/2015   Coagulation Parameters No results found for: INR, LABPROT, APTT, PLT Cardiovascular No results found for: BNP, HGB, HCT Note: Lab results reviewed.  Recent Diagnostic Imaging Review  No results found.  Meds  The  patient has a current medication list which includes the following prescription(s): aspirin, atorvastatin, vitamin d3, docusate sodium, finasteride, lubiprostone, magnesium oxide, methadone, methadone, methadone, naloxone, oxycodone hcl, pregabalin, promethazine, sumatriptan, tamsulosin, topiramate, and benefiber.  Current Outpatient Prescriptions on File Prior to Visit  Medication Sig  . aspirin 81 MG chewable tablet Chew 81 mg by mouth.  Marland Kitchen atorvastatin (LIPITOR) 40 MG tablet Take 40 mg by mouth daily at 6 PM.   . Cholecalciferol (VITAMIN D3) 2000 units capsule Take 1 capsule (2,000 Units total) by mouth daily.  Marland Kitchen docusate sodium (STOOL SOFTENER) 100 MG capsule Take by mouth.  . lubiprostone (AMITIZA) 24 MCG capsule Take 1 capsule (24 mcg total) by mouth 2 (two) times daily with a meal. Swallow the medication whole. Do not break or chew the medication.  . magnesium oxide (MAG-OX) 400 MG tablet Take 1 tablet (400 mg total) by mouth daily.  . methadone (DOLOPHINE) 5 MG tablet Take 1 tablet (5 mg total) by mouth 3 (three) times daily. Max: 3/day  . [START ON 03/24/2016] methadone (DOLOPHINE) 5 MG tablet Take 1 tablet (5 mg total) by mouth 2 (two) times daily. Max: 2/day  . [START ON 03/31/2016] methadone (DOLOPHINE) 5 MG tablet Take 1 tablet (5 mg total) by mouth daily. Max: 1/day  . Naloxone HCl (NARCAN) 4 MG/0.1ML LIQD Place 1 spray into the nose once. Spray half of bottle content into each nostril, then call 911  . [START ON 04/22/2016] Oxycodone HCl 10 MG TABS Take 1 tablet (10 mg total) by mouth every 8 (eight) hours as needed.  . pregabalin (LYRICA) 150 MG capsule Take 1 capsule (150 mg total) by mouth 3 (three) times daily.  . promethazine (PHENERGAN) 25 MG tablet Take 25 mg by mouth every 6 (six) hours as needed for nausea or vomiting.  . SUMAtriptan (IMITREX) 50 MG tablet Take 50 mg by mouth.  . tamsulosin (FLOMAX) 0.4 MG CAPS capsule Take 0.4 mg by mouth daily.  . Wheat Dextrin (BENEFIBER)  POWD Stir 2 tsp. TID into 4-8 oz of any non-carbonated beverage or soft food (hot or cold)   No current facility-administered medications on file prior to visit.    ROS  Constitutional: Denies any fever or chills Gastrointestinal: No reported hemesis, hematochezia, vomiting, or acute GI distress Musculoskeletal: Denies any acute onset joint swelling, redness, loss of ROM, or weakness Neurological: No reported episodes of acute onset apraxia, aphasia, dysarthria, agnosia, amnesia, paralysis, loss of coordination, or loss of consciousness  Allergies  Craig Alvarado has No Known Allergies.  PFSH  Drug: Craig Alvarado  reports that he does not use drugs. Alcohol:  reports that he drinks alcohol. Tobacco:  reports that he quit smoking about 8 years ago. He has never used smokeless  tobacco. 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 Family: family history includes Alcohol abuse in his father; Alzheimer's disease in his mother; Cancer in his father.  Past Surgical History:  Procedure Laterality Date  . SPINE SURGERY     Constitutional Exam  General appearance: Well nourished, well developed, and well hydrated. In no apparent acute distress Vitals:   03/21/16 1157  BP: (!) 128/44  Pulse: (!) 50  Resp: 16  Temp: 97.8 F (36.6 C)  TempSrc: Oral  SpO2: 98%  Weight: 200 lb (90.7 kg)  Height: 5' 10"  (1.778 m)   BMI Assessment: Estimated body mass index is 28.7 kg/m as calculated from the following:   Height as of this encounter: 5' 10"  (1.778  m).   Weight as of this encounter: 200 lb (90.7 kg).  BMI interpretation table: BMI level Category Range association with higher incidence of chronic pain  <18 kg/m2 Underweight   18.5-24.9 kg/m2 Ideal body weight   25-29.9 kg/m2 Overweight Increased incidence by 20%  30-34.9 kg/m2 Obese (Class I) Increased incidence by 68%  35-39.9 kg/m2 Severe obesity (Class II) Increased incidence by 136%  >40 kg/m2 Extreme obesity (Class III) Increased incidence by 254%   BMI Readings from Last 4 Encounters:  03/21/16 28.70 kg/m  02/15/16 28.70 kg/m  02/10/16 28.70 kg/m  01/17/16 28.70 kg/m   Wt Readings from Last 4 Encounters:  03/21/16 200 lb (90.7 kg)  02/15/16 200 lb (90.7 kg)  02/10/16 200 lb (90.7 kg)  01/17/16 200 lb (90.7 kg)  Psych/Mental status: Alert, oriented x 3 (person, place, & time) Eyes: PERLA Respiratory: No evidence of acute respiratory distress  Cervical Spine Exam  Inspection: No masses, redness, or swelling Alignment: Symmetrical Functional ROM: Unrestricted ROM Stability: No instability detected Muscle strength & Tone: Functionally intact Sensory: Unimpaired Palpation: Non-contributory  Upper Extremity (UE) Exam    Side: Right upper extremity  Side: Left upper extremity  Inspection: No masses, redness, swelling, or asymmetry  Inspection: No masses, redness, swelling, or asymmetry  Functional ROM: Unrestricted ROM          Functional ROM: Unrestricted ROM          Muscle strength & Tone: Functionally intact  Muscle strength & Tone: Functionally intact  Sensory: Unimpaired  Sensory: Unimpaired  Palpation: Non-contributory  Palpation: Non-contributory   Thoracic Spine Exam  Inspection: No masses, redness, or swelling Alignment: Symmetrical Functional ROM: Unrestricted ROM Stability: No instability detected Sensory: Unimpaired Muscle strength & Tone: Functionally intact Palpation: Non-contributory  Lumbar Spine Exam  Inspection: No masses, redness, or  swelling Alignment: Symmetrical Functional ROM: Unrestricted ROM Stability: No instability detected Muscle strength & Tone: Functionally intact Sensory: Unimpaired Palpation: Non-contributory Provocative Tests: Lumbar Hyperextension and rotation test: evaluation deferred today       Patrick's Maneuver: evaluation deferred today              Gait & Posture Assessment  Ambulation: Unassisted Gait: Relatively normal for age and body habitus Posture: WNL   Lower Extremity Exam    Side: Right lower extremity  Side: Left lower extremity  Inspection: No masses, redness, swelling, or asymmetry  Inspection: No masses, redness, swelling, or asymmetry  Functional ROM: Unrestricted ROM  Functional ROM: Unrestricted ROM          Muscle strength & Tone: Functionally intact  Muscle strength & Tone: Functionally intact  Sensory: Unimpaired  Sensory: Unimpaired  Palpation: Non-contributory  Palpation: Non-contributory   Assessment  Primary Diagnosis & Pertinent Problem List: The primary encounter diagnosis was Chronic pain syndrome. 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  Visit Diagnosis: 1. Chronic pain syndrome   2. Long term prescription opiate use   3. 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 Marijuana use   5. Opiate use (600 MME/Day)   6. Encounter for therapeutic drug level monitoring   7. Carpal tunnel syndrome, unspecified laterality   8. Chronic cervical radicular pain (Right)   9. Bilateral carpal tunnel syndrome   10. 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 Chronic neck  pain   12. Chronic right shoulder pain   13. Chronic pain of right upper extremity    Plan of Care  Pharmacotherapy (Medications Ordered): No orders of the defined types were placed in this encounter.  New Prescriptions   No medications on file   Medications administered today: Craig Alvarado had no medications administered during this visit. Lab-work, procedure(s), and/or referral(s): Orders Placed This Encounter  Procedures  . DG Cervical Spine Complete  . DG Shoulder Right  . ToxASSURE Select 13 (MW), Urine  . Comprehensive metabolic panel  . C-reactive protein  . Magnesium  . Sedimentation rate  . Vitamin B12  . 25-Hydroxyvitamin D Lcms D2+D3   Imaging and/or referral(s): MR CERVICAL SPINE WO CONTRAST DG CERVICAL SPINE COMPLETE DG SHOULDER RIGHT  Interventional therapies: Planned, scheduled, and/or pending:   Diagnostic right-sided Cervical epidural steroid injection under fluoroscopic guidance, with or without sedation.    Considering:   Diagnostic right-sided Cervical epidural steroid injection under fluoroscopic guidance, with or without sedation.    Palliative PRN treatment(s):   Diagnostic right-sided Cervical epidural steroid injection under fluoroscopic guidance, with or without sedation.    Provider-requested follow-up: Return for previously scheduled appointment.  Future Appointments Date Time Provider [place]  05/18/2016 10:00 AM Milinda Pointer, MD [facility] None   Primary Care Physician: Fritzi Mandes, MD Location: [facility] Outpatient Pain Management Facility Note by: Kathlen Brunswick. Dossie Arbour, M.D, DABA, DABAPM, DABPM, DABIPP, FIPP  Pain Score Disclaimer: We use the NRS-11 scale. This is a self-reported, subjective measurement of pain severity with only modest accuracy. It is used primarily to identify changes within a particular patient. It must be understood that outpatient pain scales are significantly less accurate that those used for  research, where they can be applied under ideal controlled circumstances with minimal exposure to variables. In reality, the score is likely to be a combination of pain intensity and pain affect, where pain affect describes the degree of emotional arousal or changes in action readiness caused by the sensory experience of pain. Factors such as social and work situation, setting, emotional state, anxiety levels, expectation, and prior pain experience may influence pain perception and show large inter-individual differences that may also be affected by time variables.  Patient instructions provided during this appointment: There are no Patient Instructions on file for this visit.

## 2016-03-21 NOTE — Progress Notes (Signed)
-   Normal fasting (NPO x 8 hours) glucose levels are between 65-99 mg/dl, with 2 hour fasting, levels are usually less than 140 mg/dl. Any random blood glucose level greater than 200 mg/dl is considered to be Diabetes. - BUN levels between 7 to 20 mg/dL (2.5 to 7.1 mmol/L) are considered normal. Elevated blood urea nitrogen can also be due to: urinary tract obstruction; congestive heart failure or recent heart attack; gastrointestinal bleeding; dehydration; shock; severe burns; certain medications, such as corticosteroids and some antibiotics; and/or a high protein diet. - BUN-to-creatinine ratio >20:1 (BUN dispropertionally higher than the creatinine levels) suggests prerenal azotemia (dehydration or renal hypoperfusion), while <10:1 levels suggest renal damage.   - Normal calcium levels are between 9.0 and 10.5 mg/dl. The most common cause of low total calcium is: Low blood protein levels, especially a low level of albumin, which can result from liver disease or malnutrition, both of which may result from alcoholism or other illnesses. Low albumin is also very common in people who are acutely ill. With low albumin, only the bound calcium is low. Ionized calcium remains normal, and calcium metabolism is being regulated appropriately. Some other causes of hypocalcemia include: Underactive parathyroid gland (hypoparathyroidism); Inherited resistance to the effects of parathyroid hormone; Extreme deficiency in dietary calcium; Decreased levels of vitamin D; Magnesium deficiency; Increased levels of phosphorus; Acute inflammation of the pancreas (pancreatitis); & Renal failure. - While most low ALT level results indicate a normal healthy liver, that may not always be the case. A low-functioning or non-functioning liver, lacking normal levels of ALT activity to begin with, would not release a lot of ALT into the blood when damaged. People infected with the hepatitis C virus initially show high ALT levels in their  blood, but these levels fall over time. Because the ALT test measures ALT levels at only one point in time, people with chronic hepatitis C infection may already have experienced the ALT peak well before blood was drawn for the ALT test. Urinary tract infections or malnutrition may also cause low blood ALT levels.

## 2016-03-25 LAB — 25-HYDROXYVITAMIN D LCMS D2+D3
25-HYDROXY, VITAMIN D-2: 2.1 ng/mL
25-HYDROXY, VITAMIN D-3: 39 ng/mL

## 2016-03-25 LAB — 25-HYDROXY VITAMIN D LCMS D2+D3: 25-Hydroxy, Vitamin D: 41 ng/mL

## 2016-03-26 LAB — TOXASSURE SELECT 13 (MW), URINE

## 2016-03-27 ENCOUNTER — Other Ambulatory Visit: Payer: Self-pay

## 2016-03-27 NOTE — Telephone Encounter (Signed)
Please call pt concerning med refill for lyrica

## 2016-03-28 ENCOUNTER — Other Ambulatory Visit: Payer: Self-pay | Admitting: Pain Medicine

## 2016-03-28 ENCOUNTER — Telehealth: Payer: Self-pay

## 2016-03-28 DIAGNOSIS — M792 Neuralgia and neuritis, unspecified: Secondary | ICD-10-CM

## 2016-03-28 NOTE — Telephone Encounter (Signed)
Craig Alvarado was given a

## 2016-03-28 NOTE — Telephone Encounter (Signed)
Mr Rivette states he does not have Lyrica prescription.  Investigated and Mr Mahoney was ordered Lyrica on 02-10-16, not to be filled until 02-23-16.  His pharmacy was called and they stated that he had not filled the prescription.  Called patient and notified him that he had not gotten that prescription filled and could go pick it up.

## 2016-03-28 NOTE — Telephone Encounter (Signed)
Attempted phone call to patient.  No mailbox availability.

## 2016-03-28 NOTE — Telephone Encounter (Signed)
Mr Gutowski stated that he did not have any pain medication to last him from 04-07-16 to 04-22-16.  Spoke with Dr Dossie Arbour and patient had gotten taper prescriptions mixed up and had gotten them filled out of order.  At last encounter on 03-21-16, patient still  Had 4 prescriptions left that had not been filled.  Attempted to call Mr Sowerby to explain this to him.  Answering machine picked up and was unable to leave a message.

## 2016-03-30 ENCOUNTER — Encounter: Payer: Medicare Other | Admitting: Pain Medicine

## 2016-05-18 ENCOUNTER — Encounter: Payer: Medicare Other | Admitting: Pain Medicine

## 2016-05-18 ENCOUNTER — Ambulatory Visit: Payer: Medicare HMO | Attending: Pain Medicine | Admitting: Pain Medicine

## 2016-05-18 ENCOUNTER — Encounter: Payer: Self-pay | Admitting: Pain Medicine

## 2016-05-18 VITALS — BP 133/71 | HR 46 | Temp 97.9°F | Resp 18 | Ht 70.0 in | Wt 194.0 lb

## 2016-05-18 DIAGNOSIS — I10 Essential (primary) hypertension: Secondary | ICD-10-CM | POA: Diagnosis not present

## 2016-05-18 DIAGNOSIS — M4802 Spinal stenosis, cervical region: Secondary | ICD-10-CM | POA: Insufficient documentation

## 2016-05-18 DIAGNOSIS — Z87891 Personal history of nicotine dependence: Secondary | ICD-10-CM | POA: Diagnosis not present

## 2016-05-18 DIAGNOSIS — M79673 Pain in unspecified foot: Secondary | ICD-10-CM | POA: Diagnosis not present

## 2016-05-18 DIAGNOSIS — E782 Mixed hyperlipidemia: Secondary | ICD-10-CM | POA: Diagnosis not present

## 2016-05-18 DIAGNOSIS — K219 Gastro-esophageal reflux disease without esophagitis: Secondary | ICD-10-CM | POA: Insufficient documentation

## 2016-05-18 DIAGNOSIS — Z79899 Other long term (current) drug therapy: Secondary | ICD-10-CM | POA: Insufficient documentation

## 2016-05-18 DIAGNOSIS — F1190 Opioid use, unspecified, uncomplicated: Secondary | ICD-10-CM

## 2016-05-18 DIAGNOSIS — T402X5A Adverse effect of other opioids, initial encounter: Secondary | ICD-10-CM | POA: Diagnosis not present

## 2016-05-18 DIAGNOSIS — R5383 Other fatigue: Secondary | ICD-10-CM | POA: Diagnosis not present

## 2016-05-18 DIAGNOSIS — E559 Vitamin D deficiency, unspecified: Secondary | ICD-10-CM | POA: Diagnosis not present

## 2016-05-18 DIAGNOSIS — M1990 Unspecified osteoarthritis, unspecified site: Secondary | ICD-10-CM | POA: Insufficient documentation

## 2016-05-18 DIAGNOSIS — Z79891 Long term (current) use of opiate analgesic: Secondary | ICD-10-CM | POA: Insufficient documentation

## 2016-05-18 DIAGNOSIS — K5903 Drug induced constipation: Secondary | ICD-10-CM | POA: Insufficient documentation

## 2016-05-18 DIAGNOSIS — Z7982 Long term (current) use of aspirin: Secondary | ICD-10-CM | POA: Diagnosis not present

## 2016-05-18 DIAGNOSIS — E291 Testicular hypofunction: Secondary | ICD-10-CM | POA: Diagnosis not present

## 2016-05-18 DIAGNOSIS — R413 Other amnesia: Secondary | ICD-10-CM | POA: Diagnosis not present

## 2016-05-18 DIAGNOSIS — M5412 Radiculopathy, cervical region: Secondary | ICD-10-CM | POA: Insufficient documentation

## 2016-05-18 DIAGNOSIS — Z5181 Encounter for therapeutic drug level monitoring: Secondary | ICD-10-CM | POA: Insufficient documentation

## 2016-05-18 DIAGNOSIS — Z811 Family history of alcohol abuse and dependence: Secondary | ICD-10-CM | POA: Diagnosis not present

## 2016-05-18 DIAGNOSIS — Z8719 Personal history of other diseases of the digestive system: Secondary | ICD-10-CM | POA: Diagnosis not present

## 2016-05-18 DIAGNOSIS — M79641 Pain in right hand: Secondary | ICD-10-CM | POA: Diagnosis present

## 2016-05-18 DIAGNOSIS — G43909 Migraine, unspecified, not intractable, without status migrainosus: Secondary | ICD-10-CM | POA: Diagnosis not present

## 2016-05-18 DIAGNOSIS — G894 Chronic pain syndrome: Secondary | ICD-10-CM

## 2016-05-18 DIAGNOSIS — M792 Neuralgia and neuritis, unspecified: Secondary | ICD-10-CM

## 2016-05-18 DIAGNOSIS — Z809 Family history of malignant neoplasm, unspecified: Secondary | ICD-10-CM | POA: Insufficient documentation

## 2016-05-18 MED ORDER — OXYCODONE HCL 10 MG PO TABS: 10.0000 mg | ORAL_TABLET | Freq: Three times a day (TID) | ORAL | 0 refills | Status: DC | PRN

## 2016-05-18 MED ORDER — PREGABALIN 150 MG PO CAPS: 150.0000 mg | ORAL_CAPSULE | Freq: Three times a day (TID) | ORAL | 0 refills | Status: DC

## 2016-05-18 MED ORDER — NALOXEGOL OXALATE 25 MG PO TABS: 25.0000 mg | ORAL_TABLET | Freq: Every day | ORAL | 0 refills | Status: DC

## 2016-05-18 NOTE — Progress Notes (Signed)
Patient's Name: Craig Alvarado  MRN: [id number]  Referring Provider: Fritzi Mandes, MD  DOB: Sep 17, 1945  PCP: Fritzi Mandes, MD  DOS: 05/18/2016  Note by: Kathlen Brunswick. Dossie Arbour, MD  Service setting: Ambulatory outpatient  Specialty: Interventional Pain Management  Location: [facility] (AMB) Pain Management Facility    Patient type: Established   Primary Reason(s) for Visit: Encounter for prescription drug management (Level of risk: moderate) CC: Hand Pain (bilateral)  HPI  Craig Alvarado is a 71 y.o. year old, male patient, who comes today for a medication management evaluation. 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[place]); Encounter for therapeutic drug level monitoring; Encounter for chronic pain management; Opioid dependence ([place]); Opioid-induced constipation (OIC); Chronic cervical radicular pain (Right); 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 His primarily concern today is the Hand Pain (bilateral)  Pain Assessment: Self-Reported Pain Score: 3  (pain scale information given)/10             Reported level is compatible with observation.          Craig Alvarado was last seen on 03/21/2016 for  medication management. During today's appointment we reviewed Craig Alvarado chronic pain status, as well as his outpatient medication regimen.  The patient  reports that he does not use drugs. His body mass index is 27.84 kg/m.  Further details on both, my assessment(s), as well as the proposed treatment plan, please see below.  Controlled Substance Pharmacotherapy Assessment REMS (Risk Evaluation and Mitigation Strategy)  Analgesic:Oxycodone IR 10 mg every 8 hours (30 mg/day of oxycodone) Initial MME/day:600 mg/day(Methadone 10 mg 2 tablets every 8 hours (60 mg/day of methadone)) Current MME/day:45 mg/day  Zenovia Jarred, RN  05/18/2016 10:43 AM  Addendum Nursing Pain Medication Assessment:  Safety precautions to be maintained throughout the outpatient stay will include: orient to surroundings, keep bed in low position, maintain call bell within reach at all times, provide assistance with transfer out of bed and ambulation.  Medication Inspection Compliance: Pill count conducted under aseptic conditions, in front of the patient. Neither the pills nor the bottle was removed from the patient's sight at any time. Once count was completed pills were immediately returned to the patient in their original bottle.  Medication: Oxycodone IR Pill Count: 16 of 90 pills remain Bottle Appearance: Standard pharmacy container. Clearly labeled. Filled Date: 01 / 03 / 2018 Medication last intake: 05-18-16   Pharmacokinetics: Liberation and absorption (onset of action): WNL Distribution (time to peak effect): WNL Metabolism and excretion (duration of action): WNL         Pharmacodynamics: Desired effects: Analgesia: Craig Alvarado reports >50% benefit. Functional ability: Patient reports that medication allows him to accomplish basic ADLs Clinically meaningful improvement in function (CMIF): Sustained CMIF goals met Perceived effectiveness: Described as relatively effective, allowing for increase in  activities of daily living (ADL) Undesirable effects: Side-effects  or Adverse reactions: None reported Monitoring: [place] PMP: Online review of the past 57-monthperiod conducted. Compliant with practice rules and regulations List of all UDS test(s) done:  Lab Results  Component Value Date   TOXASSSELUR FINAL 03/21/2016   [place]AL 11/18/2015   [place] Lake[place]AL 09/16/2015   [place]FINAL 05/24/2015   TOXASSSELUR FINAL 03/29/2015   Last UDS on record: ToxAssure Select 13  Date Value Ref Range Status  03/21/2016 FINAL  Final    Comment:    ==================================================================== TOXASSURE SELECT 13 (MW) ==================================================================== Test                             Result       Flag       Units Drug Present and Declared for Prescription Verification   Methadone                      2047         EXPECTED   ng/mg creat   EDDP (Methadone Mtb)           4601         EXPECTED   ng/mg creat    Sources of methadone include scheduled prescription medications.    EDDP is an expected metabolite of methadone. Drug Absent but Declared for Prescription Verification   Oxycodone                      Not Detected UNEXPECTED ng/mg creat ==================================================================== Test                      Result    Flag   Units      Ref Range   Creatinine              115              mg/dL      >=20 ==================================================================== Declared Medications:  The flagging and interpretation on this report are based on the  following declared medications.  Unexpected results may arise from  inaccuracies in the declared medications.  **Note: The testing scope of this panel includes these medications:  Methadone  Oxycodone  **Note: The testing scope of this panel does not include following  reported medications:  Aspirin  Atorvastatin  Cholecalciferol   Docusate  Finasteride  Lubiprostone  Magnesium Oxide  Naloxone  Pregabalin  Promethazine  Sumatriptan  Supplement  Tamsulosin  Topiramate ==================================================================== For clinical consultation, please call [phone] ====================================================================    UDS interpretation: Compliant          Medication Assessment Form: Reviewed. Patient indicates being compliant with therapy Treatment compliance: Compliant Risk Assessment Profile: Aberrant behavior: See prior evaluations. None observed or detected today Comorbid factors increasing risk of overdose: See prior notes. No additional risks detected today Risk of substance use disorder (SUD): Low Opioid Risk Tool (ORT) Total Score: 0  Interpretation Table:  Score <3 = Low Risk for SUD  Score between 4-7 = Moderate Risk for SUD  Score >8 = High Risk for Opioid Abuse   Risk Mitigation Strategies:  Patient Counseling: Covered Patient-Prescriber Agreement (PPA): Present and active  Notification to other healthcare providers: Done  Pharmacologic Plan: No change in therapy, at this time  Laboratory Chemistry  Inflammation Markers Lab Results  Component Value Date   ESRSEDRATE 13 03/21/2016   CRP <0.8 03/21/2016   Renal Function Lab Results  Component Value Date   BUN 23 (H) 03/21/2016   CREATININE 1.09 03/21/2016   GFRAA >60 03/21/2016   GFRNONAA >60 03/21/2016   Hepatic Function Lab Results  Component Value Date   AST 21 03/21/2016   ALT 16 (L) 03/21/2016   ALBUMIN 4.0 03/21/2016   Electrolytes Lab Results  Component Value Date   NA 139 03/21/2016   K 4.1 03/21/2016   CL 108 03/21/2016   CALCIUM 8.7 (L) 03/21/2016   MG 2.2 03/21/2016   Pain Modulating Vitamins Lab Results  Component Value Date   25OHVITD1 41 03/21/2016   25OHVITD2 2.1 03/21/2016   25OHVITD3 39 03/21/2016   VITAMINB12 658 03/21/2016   Coagulation  Parameters No results found for: INR, LABPROT, APTT, PLT Cardiovascular No results found for: BNP, HGB, HCT Note: Lab results reviewed.  Recent Diagnostic Imaging Review  Dg Cervical Spine Complete  Result Date: 03/21/2016 CLINICAL DATA:  Chronic neck and shoulder pain since cervical fusion in 2001 EXAM: [place] 4+ VIEW COMPARISON:  None. FINDINGS: Plate and screw fixation device from anterior fusion noted at C4-5. Fusion across the C5-6 and C6-7 disc spaces as well. Normal alignment. Degenerative disc disease at C3-4 with disc space narrowing and spurring. No fracture. Prevertebral soft tissues are normal. IMPRESSION: Fusion changes from C4-C7 with anterior plate at L9-7. No acute bony abnormality. Electronically Signed   By: Rolm Baptise M.D.   On: 03/21/2016 14:37   Dg Shoulder Right  Result Date: 03/21/2016 CLINICAL DATA:  Chronic right shoulder pain, no known injury, initial encounter EXAM: RIGHT SHOULDER - 2+ VIEW COMPARISON:  None. FINDINGS: There is no evidence of fracture or dislocation. There is no evidence of arthropathy or other focal bone abnormality. Soft tissues are unremarkable. IMPRESSION: No acute abnormality noted. Electronically Signed   By: Inez Catalina M.D.   On: 03/21/2016 14:38   Note: Imaging results reviewed.          Meds  The patient has a current medication list which includes the following prescription(s): aspirin, atorvastatin, vitamin d3, docusate sodium, finasteride, lubiprostone, magnesium oxide, naloxegol oxalate, naloxone, oxycodone hcl, oxycodone hcl, oxycodone hcl, pregabalin, promethazine, sumatriptan, tamsulosin, topiramate, and benefiber.  Current Outpatient Prescriptions on File Prior to Visit  Medication Sig  . aspirin 81 MG chewable tablet Chew 81 mg by mouth.  Marland Kitchen atorvastatin (LIPITOR) 40 MG tablet Take 40 mg by mouth daily at 6 PM.   . Cholecalciferol (VITAMIN D3) 2000 units capsule Take 1 capsule (2,000 Units total) by mouth  daily.  Marland Kitchen docusate sodium (STOOL SOFTENER) 100 MG capsule Take by mouth.  . finasteride (PROSCAR) 5 MG tablet TAKE 1 TABLET BY MOUTH  DAILY  . lubiprostone (AMITIZA) 24 MCG capsule Take 1 capsule (24 mcg total) by mouth 2 (two) times daily with a meal. Swallow the medication whole. Do not break or chew the medication.  . magnesium oxide (MAG-OX) 400 MG tablet Take 1 tablet (400 mg total) by mouth daily.  . Naloxone HCl (NARCAN) 4 MG/0.1ML LIQD Place 1 spray into the nose once. Spray half of bottle content into each nostril, then call 911  . promethazine (PHENERGAN) 25 MG tablet Take 25 mg by mouth every 6 (six) hours as needed for nausea or vomiting.  . tamsulosin (FLOMAX) 0.4 MG CAPS capsule Take 0.4 mg by mouth daily.  Marland Kitchen topiramate (TOPAMAX) 50 MG tablet Take 50 mg by mouth daily.   . Wheat Dextrin (BENEFIBER) POWD Stir 2 tsp. TID into 4-8 oz  of any non-carbonated beverage or soft food (hot or cold)   No current facility-administered medications on file prior to visit.    ROS  Constitutional: Denies any fever or chills Gastrointestinal: No reported hemesis, hematochezia, vomiting, or acute GI distress Musculoskeletal: Denies any acute onset joint swelling, redness, loss of ROM, or weakness Neurological: No reported episodes of acute onset apraxia, aphasia, dysarthria, agnosia, amnesia, paralysis, loss of coordination, or loss of consciousness  Allergies  Craig Alvarado has No Known Allergies.  PFSH  Drug: Craig Alvarado  reports that he does not use drugs. Alcohol:  reports that he drinks alcohol. Tobacco:  reports that he quit smoking about 8 years ago. He has never used smokeless tobacco. 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 Family: family history includes Alcohol abuse in his father; Alzheimer's disease in his mother; Cancer in his father.  Past Surgical History:  Procedure Laterality Date  . SPINE SURGERY     Constitutional Exam  General appearance: Well nourished, well developed, and well hydrated. In no apparent acute distress Vitals:   05/18/16 1003  BP: 133/71  Pulse: (!) 46  Resp: 18  Temp: 97.9 F (36.6 C)  TempSrc: Oral  SpO2: 99%  Weight: 194 lb (88 kg)  Height: 5' 10"  (1.778 m)   BMI Assessment: Estimated body mass index is 27.84 kg/m as calculated from the following:   Height as of this encounter: 5' 10"  (1.778 m).   Weight as of this encounter: 194 lb (88 kg).  BMI interpretation table: BMI level Category Range association with higher incidence of chronic pain  <18 kg/m2 Underweight   18.5-24.9 kg/m2 Ideal body weight   25-29.9 kg/m2 Overweight Increased incidence by 20%  30-34.9 kg/m2 Obese (Class I) Increased incidence by 68%  35-39.9 kg/m2 Severe obesity (Class II) Increased incidence by 136%  >40 kg/m2 Extreme obesity (Class III) Increased incidence by 254%   BMI Readings from Last 4 Encounters:  05/18/16 27.84 kg/m  03/21/16 28.70 kg/m  02/15/16 28.70 kg/m  02/10/16 28.70 kg/m   Wt Readings from Last 4 Encounters:  05/18/16 194 lb (88 kg)  03/21/16 200 lb (90.7 kg)  02/15/16 200 lb (90.7 kg)  02/10/16 200 lb (90.7 kg)  Psych/Mental status: Alert, oriented x 3 (person, place, & time)       Eyes: PERLA Respiratory: No evidence of acute respiratory distress  Cervical Spine Exam  Inspection: No masses, redness, or swelling Alignment: Symmetrical Functional ROM: Unrestricted ROM Stability: No instability detected Muscle strength & Tone: Functionally intact Sensory:  Unimpaired Palpation: Non-contributory  Upper Extremity (UE) Exam    Side: Right upper extremity  Side: Left upper extremity  Inspection: No masses, redness, swelling, or asymmetry  Inspection: No masses, redness, swelling, or asymmetry  Functional ROM: Unrestricted ROM          Functional ROM: Unrestricted ROM          Muscle strength & Tone: Functionally intact  Muscle strength & Tone: Functionally intact  Sensory: Unimpaired  Sensory: Unimpaired  Palpation: Non-contributory  Palpation: Non-contributory   Thoracic Spine Exam  Inspection: No masses, redness, or swelling Alignment: Symmetrical Functional ROM: Unrestricted ROM Stability: No instability detected Sensory: Unimpaired Muscle strength &  Tone: Functionally intact Palpation: Non-contributory  Lumbar Spine Exam  Inspection: No masses, redness, or swelling Alignment: Symmetrical Functional ROM: Unrestricted ROM Stability: No instability detected Muscle strength & Tone: Functionally intact Sensory: Unimpaired Palpation: Non-contributory Provocative Tests: Lumbar Hyperextension and rotation test: evaluation deferred today       Patrick's Maneuver: evaluation deferred today              Gait & Posture Assessment  Ambulation: Unassisted Gait: Relatively normal for age and body habitus Posture: WNL   Lower Extremity Exam    Side: Right lower extremity  Side: Left lower extremity  Inspection: No masses, redness, swelling, or asymmetry  Inspection: No masses, redness, swelling, or asymmetry  Functional ROM: Unrestricted ROM          Functional ROM: Unrestricted ROM          Muscle strength & Tone: Functionally intact  Muscle strength & Tone: Functionally intact  Sensory: Unimpaired  Sensory: Unimpaired  Palpation: Non-contributory  Palpation: Non-contributory   Assessment  Primary Diagnosis & Pertinent Problem List: The primary encounter diagnosis was Chronic pain syndrome. Diagnoses of Long term current use of opiate  analgesic, Opiate use (45 MME/Day), Neurogenic pain, and Opioid-induced constipation (OIC) were also pertinent to this visit.  Status Diagnosis  Controlled Controlled Controlled 1. Chronic pain syndrome   2. Long term current use of opiate analgesic   3. Opiate use (45 MME/Day)   4. Neurogenic pain   5. Opioid-induced constipation (OIC)      Plan of Care  Pharmacotherapy (Medications Ordered): Meds ordered this encounter  Medications  . pregabalin (LYRICA) 150 MG capsule    Sig: Take 1 capsule (150 mg total) by mouth 3 (three) times daily.    Dispense:  270 capsule    Refill:  0    Do not place this medication, or any other prescription from our practice, on "Automatic Refill". Patient may have prescription filled one day early if pharmacy is closed on scheduled refill date.  . naloxegol oxalate (MOVANTIK) 25 MG TABS tablet    Sig: Take 1 tablet (25 mg total) by mouth daily. Take on an empty stomach at least 1 hour before or 2 hours after a meal.    Dispense:  90 tablet    Refill:  0    Please instruct the patient not to break the tablet.  . Oxycodone HCl 10 MG TABS    Sig: Take 1 tablet (10 mg total) by mouth every 8 (eight) hours as needed.    Dispense:  90 tablet    Refill:  0    Do not place this medication, or any other prescription from our practice, on "Automatic Refill". Patient may have prescription filled one day early if pharmacy is closed on scheduled refill date. Do not fill until: 05/22/16 To last until: 06/21/16  . Oxycodone HCl 10 MG TABS    Sig: Take 1 tablet (10 mg total) by mouth every 8 (eight) hours as needed.    Dispense:  90 tablet    Refill:  0    Do not place this medication, or any other prescription from our practice, on "Automatic Refill". Patient may have prescription filled one day early if pharmacy is closed on scheduled refill date. Do not fill until: 06/21/16 To last until: 07/21/16  . Oxycodone HCl 10 MG TABS    Sig: Take 1 tablet (10 mg  total) by mouth every 8 (eight) hours as needed.    Dispense:  90  tablet    Refill:  0    Do not place this medication, or any other prescription from our practice, on "Automatic Refill". Patient may have prescription filled one day early if pharmacy is closed on scheduled refill date. Do not fill until: 07/21/16 To last until: 08/20/16   New Prescriptions   NALOXEGOL OXALATE (MOVANTIK) 25 MG TABS TABLET    Take 1 tablet (25 mg total) by mouth daily. Take on an empty stomach at least 1 hour before or 2 hours after a meal.   Medications administered today: Craig Alvarado had no medications administered during this visit. Lab-work, procedure(s), and/or referral(s): Orders Placed This Encounter  Procedures  . ToxASSURE Select 13 (MW), Urine   Imaging and/or referral(s): None  Interventional therapies: Planned, scheduled, and/or pending:   Not at this time.   Considering:   Right sided cervical epidural steroid injection under fluoroscopic guidance, without without sedation.    Palliative PRN treatment(s):   Right sided cervical epidural steroid injection under fluoroscopic guidance, without without sedation.    Provider-requested follow-up: Return in about 3 months (around 08/16/2016) for (NP) Med-Mgmt, in addition, (PRN) procedure.  No future appointments. Primary Care Physician: Fritzi Mandes, MD Location: [facility] Note by: Kathlen Brunswick. Dossie Arbour, M.D, DABA, DABAPM, DABPM, DABIPP, FIPP Date: 05/18/2016; Time: 11:46 PM  Pain Score Disclaimer: We use the NRS-11 scale. This is a self-reported, subjective measurement of pain severity with only modest accuracy. It is used primarily to identify changes within a particular patient. It must be understood that outpatient pain scales are significantly less accurate that those used for research, where they can be applied under ideal controlled circumstances with minimal exposure to variables. In reality, the  score is likely to be a combination of pain intensity and pain affect, where pain affect describes the degree of emotional arousal or changes in action readiness caused by the sensory experience of pain. Factors such as social and work situation, setting, emotional state, anxiety levels, expectation, and prior pain experience may influence pain perception and show large inter-individual differences that may also be affected by time variables.  Patient instructions provided during this appointment: Patient Instructions  You were given 3 prescriptions for Oxycodone today and one prescription for Lyrica that will last 3 months. A prescription for Movantik, to last 3 months, was sent to your pharmacy.

## 2016-05-18 NOTE — Patient Instructions (Addendum)
You were given 3 prescriptions for Oxycodone today and one prescription for Lyrica that will last 3 months. A prescription for Movantik, to last 3 months, was sent to your pharmacy.

## 2016-05-18 NOTE — Progress Notes (Addendum)
Nursing Pain Medication Assessment:  Safety precautions to be maintained throughout the outpatient stay will include: orient to surroundings, keep bed in low position, maintain call bell within reach at all times, provide assistance with transfer out of bed and ambulation.  Medication Inspection Compliance: Pill count conducted under aseptic conditions, in front of the patient. Neither the pills nor the bottle was removed from the patient's sight at any time. Once count was completed pills were immediately returned to the patient in their original bottle.  Medication: Oxycodone IR Pill Count: 16 of 90 pills remain Bottle Appearance: Standard pharmacy container. Clearly labeled. Filled Date: 01 / 03 / 2018 Medication last intake: 05-18-16

## 2016-05-21 ENCOUNTER — Other Ambulatory Visit: Payer: Self-pay | Admitting: Pain Medicine

## 2016-05-21 DIAGNOSIS — T402X5A Adverse effect of other opioids, initial encounter: Principal | ICD-10-CM

## 2016-05-21 DIAGNOSIS — K5903 Drug induced constipation: Secondary | ICD-10-CM

## 2016-05-22 ENCOUNTER — Telehealth: Payer: Self-pay | Admitting: Pain Medicine

## 2016-05-22 NOTE — Telephone Encounter (Signed)
Patient called back and stated that Dr Dossie Arbour would need to replace his Rx's as he can not find them anywhere.  Explained that we can not rewrite or replace lost Rx's.  Encouraged Craig Alvarado to continue looking for the Rx.

## 2016-05-22 NOTE — Telephone Encounter (Signed)
Patient is unable to find his scripts wants to know if he actually received them from here. Please call patient and let him know

## 2016-05-22 NOTE — Telephone Encounter (Signed)
Called Patient to let them know that his visit on 05/19/16 does reflect that he was given Rx for oxycodone hcl 10 mg to begin filling on 05/22/16.  3 Rx's were given and 2 Rx escribed to pharmacy. Voicemail left conveying this information.

## 2016-05-23 ENCOUNTER — Telehealth: Payer: Self-pay

## 2016-05-23 NOTE — Telephone Encounter (Signed)
Patient called and  had a question about  the lyrica, Dr. Dossie Arbour wrote the prescription to take this med three times a dayl. Also, patient states he has found all his prescriptions in the back  Of his pick up truck in a garbage bag.

## 2016-05-23 NOTE — Telephone Encounter (Signed)
Pt is confused about medications from last visit would like to speak with nurse concerning medications

## 2016-05-23 NOTE — Telephone Encounter (Signed)
No answer.Left voicemail for patient to return call. 

## 2016-05-24 LAB — TOXASSURE SELECT 13 (MW), URINE

## 2016-05-29 ENCOUNTER — Telehealth: Payer: Self-pay | Admitting: Pain Medicine

## 2016-05-29 NOTE — Telephone Encounter (Signed)
Patient called stating he needs refill on Mag oxide if dr Dossie Arbour still wants him to take this med. Please call patient and let him know.

## 2016-05-30 NOTE — Telephone Encounter (Signed)
Patient notified per voicemail that he received a prescription for Magnesium on 05-23-16, with one refill. Also reminded that no prescriptions will be given outside of an appointment.

## 2016-06-05 DIAGNOSIS — R194 Change in bowel habit: Secondary | ICD-10-CM | POA: Insufficient documentation

## 2016-07-14 DIAGNOSIS — D123 Benign neoplasm of transverse colon: Secondary | ICD-10-CM | POA: Insufficient documentation

## 2016-07-17 DIAGNOSIS — G479 Sleep disorder, unspecified: Secondary | ICD-10-CM | POA: Insufficient documentation

## 2016-08-09 ENCOUNTER — Encounter: Payer: Self-pay | Admitting: Nurse Practitioner

## 2016-08-09 ENCOUNTER — Ambulatory Visit: Payer: Medicare HMO | Attending: Nurse Practitioner | Admitting: Nurse Practitioner

## 2016-08-09 VITALS — BP 170/82 | HR 63 | Temp 98.2°F | Resp 18 | Ht 70.0 in | Wt 191.0 lb

## 2016-08-09 DIAGNOSIS — Z811 Family history of alcohol abuse and dependence: Secondary | ICD-10-CM | POA: Diagnosis not present

## 2016-08-09 DIAGNOSIS — E782 Mixed hyperlipidemia: Secondary | ICD-10-CM | POA: Insufficient documentation

## 2016-08-09 DIAGNOSIS — R51 Headache: Secondary | ICD-10-CM | POA: Diagnosis not present

## 2016-08-09 DIAGNOSIS — M25511 Pain in right shoulder: Secondary | ICD-10-CM | POA: Insufficient documentation

## 2016-08-09 DIAGNOSIS — M792 Neuralgia and neuritis, unspecified: Secondary | ICD-10-CM | POA: Diagnosis not present

## 2016-08-09 DIAGNOSIS — K219 Gastro-esophageal reflux disease without esophagitis: Secondary | ICD-10-CM | POA: Insufficient documentation

## 2016-08-09 DIAGNOSIS — G5603 Carpal tunnel syndrome, bilateral upper limbs: Secondary | ICD-10-CM | POA: Diagnosis not present

## 2016-08-09 DIAGNOSIS — Z7982 Long term (current) use of aspirin: Secondary | ICD-10-CM | POA: Insufficient documentation

## 2016-08-09 DIAGNOSIS — Z87891 Personal history of nicotine dependence: Secondary | ICD-10-CM | POA: Diagnosis not present

## 2016-08-09 DIAGNOSIS — M961 Postlaminectomy syndrome, not elsewhere classified: Secondary | ICD-10-CM | POA: Insufficient documentation

## 2016-08-09 DIAGNOSIS — Z5181 Encounter for therapeutic drug level monitoring: Secondary | ICD-10-CM | POA: Insufficient documentation

## 2016-08-09 DIAGNOSIS — E291 Testicular hypofunction: Secondary | ICD-10-CM | POA: Diagnosis not present

## 2016-08-09 DIAGNOSIS — M4802 Spinal stenosis, cervical region: Secondary | ICD-10-CM | POA: Insufficient documentation

## 2016-08-09 DIAGNOSIS — Z79891 Long term (current) use of opiate analgesic: Secondary | ICD-10-CM | POA: Insufficient documentation

## 2016-08-09 DIAGNOSIS — G894 Chronic pain syndrome: Secondary | ICD-10-CM | POA: Insufficient documentation

## 2016-08-09 DIAGNOSIS — E559 Vitamin D deficiency, unspecified: Secondary | ICD-10-CM | POA: Insufficient documentation

## 2016-08-09 DIAGNOSIS — M79641 Pain in right hand: Secondary | ICD-10-CM | POA: Diagnosis present

## 2016-08-09 DIAGNOSIS — M62838 Other muscle spasm: Secondary | ICD-10-CM | POA: Insufficient documentation

## 2016-08-09 DIAGNOSIS — I10 Essential (primary) hypertension: Secondary | ICD-10-CM | POA: Diagnosis not present

## 2016-08-09 DIAGNOSIS — Z79899 Other long term (current) drug therapy: Secondary | ICD-10-CM | POA: Insufficient documentation

## 2016-08-09 MED ORDER — OXYCODONE HCL 10 MG PO TABS: 10.0000 mg | ORAL_TABLET | Freq: Three times a day (TID) | ORAL | 0 refills | Status: DC | PRN

## 2016-08-09 MED ORDER — PREGABALIN 150 MG PO CAPS: 150.0000 mg | ORAL_CAPSULE | Freq: Three times a day (TID) | ORAL | 0 refills | Status: DC

## 2016-08-09 NOTE — Progress Notes (Signed)
Patient's Name: Craig Alvarado  MRN: [id number]  Referring Provider: Fritzi Mandes, MD  DOB: 03-Oct-1945  PCP: Craig Mandes, MD  DOS: 08/09/2016  Note by: Craig Francois NP  Service setting: Ambulatory outpatient  Specialty: Interventional Pain Management  Location: [facility] (AMB) Pain Management Facility    Patient type: Established    Primary Reason(s) for Visit: Encounter for prescription drug management (Level of risk: moderate) CC: Hand Pain (bilateral)  HPI  [name] is a 71 y.o. year old, male patient, who comes today for a medication management evaluation. 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[place]); Encounter for therapeutic drug level monitoring; Encounter for chronic pain management; Opioid dependence ([place]); Opioid-induced constipation ([facility]); Chronic cervical radicular pain (Right); 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 His primarily concern today is the Hand Pain (bilateral)  Pain Assessment: Self-Reported Pain Score: 7 /10 Clinically the patient looks like a 3/10 Reported level is inconsistent with clinical observations.       Pain Descriptors / Indicators: Constant,  Aching Pain Frequency: Constant  [name] is in today for medication management. The patient  reports that he does not use drugs. His body mass index is 27.41 kg/m. He has sever CTS. He is going to have CTR of his left wrist. He admits that he has some cervical and shoulder pain. He states that his left hand is worse than the right. He states that his has been having increased insomnia. He has failed Trazodone up to 325m. He does not feel like his current regimen is effective for his CTS so he hopes that the surgery is effective.  Further details on both, my assessment(s), as well as the proposed treatment plan, please see below.  Controlled Substance Pharmacotherapy Assessment REMS (Risk Evaluation and Mitigation Strategy)  Analgesic: Oxycodone [id number] times a day ([id number]) MME/day: 45 mg/day JenAngelique HolmN  08/09/2016  2:31 PM  Sign at close encounter Nursing Pain Medication Assessment:  Safety precautions to be maintained throughout the outpatient stay will include: orient to surroundings, keep bed in low position, maintain call bell within reach at all times, provide assistance with transfer out of bed and ambulation.  Medication Inspection Compliance: Pill count conducted under aseptic conditions, in front of the patient. Neither the pills nor the bottle was removed from the patient's sight at any time. Once count was completed pills were immediately returned to the patient in their original bottle.  Medication: See above Pill/Patch Count: 51 of 90 pills remain Pill/Patch Appearance: Markings consistent with prescribed medication Bottle Appearance: Standard pharmacy container. Clearly labeled. Filled Date: 04 / 04 / 2018 Last Medication intake:  Today   Pharmacokinetics: Liberation and absorption (onset of action): WNL Distribution (time to peak effect): WNL Metabolism and excretion (duration of action): WNL         Pharmacodynamics: Desired effects: Analgesia:  Mr.  Froman reports >50% benefit. Functional ability: Patient reports that medication allows him to accomplish basic ADLs Clinically meaningful improvement in function (CMIF): Sustained CMIF goals met Perceived effectiveness: Described as relatively effective, allowing for increase in activities of daily living (ADL) Undesirable effects: Side-effects or Adverse reactions: None reported Monitoring: [place] PMP: Online review of the past 40-monthperiod conducted. Compliant with practice rules and regulations List of all UDS test(s) done:  Lab Results  Component Value Date   TOXASSSELUR FINAL 05/18/2016   [place] 03/21/2016   [place] 11/18/2015   [place] 09/16/2015   TOXASSSELUR FINAL 05/24/2015   TOXASSSELUR FINAL 03/29/2015   Last UDS on record: ToxAssure Select 13  Date Value Ref Range Status  05/18/2016 FINAL  Final    Comment:    ==================================================================== TOXASSURE SELECT 13 (MW) ==================================================================== Test                             Result       Flag       Units Drug Present and Declared for Prescription Verification   Oxycodone                      904          EXPECTED   ng/mg creat   Oxymorphone                    4128         EXPECTED   ng/mg creat   Noroxycodone                   2544         EXPECTED   ng/mg creat   Noroxymorphone                 1142         EXPECTED   ng/mg creat    Sources of oxycodone are scheduled prescription medications.    Oxymorphone, noroxycodone, and noroxymorphone are expected    metabolites of oxycodone. Oxymorphone is also available as a    scheduled prescription medication. ==================================================================== Test                      Result    Flag   Units      Ref Range   Creatinine              72               mg/dL       >=20 ==================================================================== Declared Medications:  The flagging and interpretation on this report are based on the  following declared medications.  Unexpected results may arise from  inaccuracies in the declared medications.  **Note: The testing scope of this panel includes these medications:  Oxycodone  **Note: The testing scope of this panel does not include following  reported medications:  Aspirin (Aspirin 81)  Atorvastatin (Lipitor)  Docusate (Colace)  Finasteride (Proscar)  Lubiprostone (Amitiza)  Magnesium (Mag-Ox)  Naloxegol (Movantik)  Pregabalin (Lyrica)  Promethazine  Sumatriptan  Supplement  Tamsulosin (Flomax)  Topiramate (Topamax)  Vitamin D3 ==================================================================== For clinical consultation, please call ([phone] ====================================================================    UDS interpretation: Compliant          Medication Assessment Form: Reviewed. Patient indicates being compliant with therapy Treatment compliance: Compliant Risk Assessment Profile: Aberrant behavior: See prior evaluations. None observed or detected  today Comorbid factors increasing risk of overdose: See prior notes. No additional risks detected today Risk of substance use disorder (SUD): Low Opioid Risk Tool (ORT) Total Score:    Interpretation Table:  Score <3 = Low Risk for SUD  Score between 4-7 = Moderate Risk for SUD  Score >8 = High Risk for Opioid Abuse   Risk Mitigation Strategies:  Patient Counseling: Covered Patient-Prescriber Agreement (PPA): Present and active  Notification to other healthcare providers: Done  Pharmacologic Plan: No change in therapy, at this time  Laboratory Chemistry  Inflammation Markers Lab Results  Component Value Date   CRP <0.8 03/21/2016   ESRSEDRATE 13 03/21/2016   (CRP: Acute Phase) (ESR: Chronic Phase) Renal Function Markers Lab  Results  Component Value Date   BUN 23 (H) 03/21/2016   CREATININE 1.09 03/21/2016   GFRAA >60 03/21/2016   GFRNONAA >60 03/21/2016   Hepatic Function Markers Lab Results  Component Value Date   AST 21 03/21/2016   ALT 16 (L) 03/21/2016   ALBUMIN 4.0 03/21/2016   ALKPHOS 56 03/21/2016   Electrolytes Lab Results  Component Value Date   NA 139 03/21/2016   K 4.1 03/21/2016   CL 108 03/21/2016   CALCIUM 8.7 (L) 03/21/2016   MG 2.2 03/21/2016   Neuropathy Markers Lab Results  Component Value Date   VITAMINB12 658 03/21/2016   Bone Pathology Markers Lab Results  Component Value Date   ALKPHOS 56 03/21/2016   25OHVITD1 41 03/21/2016   25OHVITD2 2.1 03/21/2016   25OHVITD3 39 03/21/2016   CALCIUM 8.7 (L) 03/21/2016   Coagulation Parameters No results found for: INR, LABPROT, APTT, PLT Cardiovascular Markers No results found for: BNP, HGB, HCT Note: Lab results reviewed.  Recent Diagnostic Imaging Review  Dg Cervical Spine Complete  Result Date: 03/21/2016 CLINICAL DATA:  Chronic neck and shoulder pain since cervical fusion in 2001 EXAM: [place] 4+ VIEW COMPARISON:  None. FINDINGS: Plate and screw fixation device from anterior fusion noted at C4-5. Fusion across the C5-6 and C6-7 disc spaces as well. Normal alignment. Degenerative disc disease at C3-4 with disc space narrowing and spurring. No fracture. Prevertebral soft tissues are normal. IMPRESSION: Fusion changes from C4-C7 with anterior plate at Z3-2. No acute bony abnormality. Electronically Signed   By: Rolm Baptise M.D.   On: 03/21/2016 14:37   Dg Shoulder Right  Result Date: 03/21/2016 CLINICAL DATA:  Chronic right shoulder pain, no known injury, initial encounter EXAM: RIGHT SHOULDER - 2+ VIEW COMPARISON:  None. FINDINGS: There is no evidence of fracture or dislocation. There is no evidence of arthropathy or other focal bone abnormality. Soft tissues are unremarkable. IMPRESSION: No acute  abnormality noted. Electronically Signed   By: Inez Catalina M.D.   On: 03/21/2016 14:38   Note: Imaging results reviewed.          Meds  The patient has a current medication list which includes the following prescription(s): aspirin, atorvastatin, vitamin d3, docusate sodium, finasteride, lubiprostone, magnesium oxide, naloxegol oxalate, naloxone, oxycodone hcl, oxycodone hcl, oxycodone hcl, pregabalin, promethazine, sumatriptan, tamsulosin, topiramate, and benefiber.  Current Outpatient Prescriptions on File Prior to Visit  Medication Sig  . aspirin 81 MG chewable tablet Chew 81 mg by mouth.  Marland Kitchen atorvastatin (LIPITOR) 40 MG tablet Take 40 mg by mouth daily at 6 PM.   . Cholecalciferol (VITAMIN D3) 2000 units capsule Take 1 capsule (2,000 Units total) by mouth daily.  Marland Kitchen docusate sodium (STOOL SOFTENER) 100 MG capsule Take  by mouth.  . finasteride (PROSCAR) 5 MG tablet TAKE 1 TABLET BY MOUTH  DAILY  . lubiprostone (AMITIZA) 24 MCG capsule Take 1 capsule (24 mcg total) by mouth 2 (two) times daily with a meal. Swallow the medication whole. Do not break or chew the medication.  . magnesium oxide (MAG-OX) 400 MG tablet Take 1 tablet (400 mg total) by mouth daily.  . naloxegol oxalate (MOVANTIK) 25 MG TABS tablet Take 1 tablet (25 mg total) by mouth daily. Take on an empty stomach at least 1 hour before or 2 hours after a meal.  . Naloxone HCl (NARCAN) 4 MG/0.1ML LIQD Place 1 spray into the nose once. Spray half of bottle content into each nostril, then call 911  . promethazine (PHENERGAN) 25 MG tablet Take 25 mg by mouth every 6 (six) hours as needed for nausea or vomiting.  . SUMAtriptan (IMITREX) 50 MG tablet TAKE ONE TABLET BY MOUTH ONCE DAILY AS NEEDED FOR  MIGRAINE.  Marland Kitchen tamsulosin (FLOMAX) 0.4 MG CAPS capsule Take 0.4 mg by mouth daily.  Marland Kitchen topiramate (TOPAMAX) 50 MG tablet Take 50 mg by mouth daily.   . Wheat Dextrin (BENEFIBER) POWD Stir 2 tsp. TID into 4-8 oz of any non-carbonated beverage or  soft food (hot or cold)   No current facility-administered medications on file prior to visit.    ROS  Constitutional: Denies any fever or chills Gastrointestinal: No reported hemesis, hematochezia, vomiting, or acute GI distress Musculoskeletal: Denies any acute onset joint swelling, redness, loss of ROM, or weakness Neurological: No reported episodes of acute onset apraxia, aphasia, dysarthria, agnosia, amnesia, paralysis, loss of coordination, or loss of consciousness  Allergies  Mr. Chapel has No Known Allergies.  PFSH  Drug: Mr. Wichman  reports that he does not use drugs. Alcohol:  reports that he drinks alcohol. Tobacco:  reports that he quit smoking about 9 years ago. He has never used smokeless tobacco. 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 Family: family history includes Alcohol abuse in his father; Alzheimer's disease in his mother; Cancer in his father.  Past Surgical History:  Procedure Laterality Date  . SPINE SURGERY     Constitutional Exam  General appearance: Well nourished, well developed, and well hydrated. In no apparent acute distress Vitals:   08/09/16 1432  BP: (!) 170/82  Pulse: 63  Resp: 18  Temp: 98.2 F (36.8 C)  TempSrc: Oral  SpO2: 98%  Weight: 191 lb (86.6 kg)  Height: _0  (1.778 m)   BMI Assessment: Estimated body mass index is 27.41 kg/m as calculated from the following:   Height as of this encounter: _1  (1.778 m).   Weight as of this encounter: 191 lb (86.6 kg).  BMI  interpretation table: BMI level Category Range association with higher incidence of chronic pain  <18 kg/m2 Underweight   18.5-24.9 kg/m2 Ideal body weight   25-29.9 kg/m2 Overweight Increased incidence by 20%  30-34.9 kg/m2 Obese (Class I) Increased incidence by 68%  35-39.9 kg/m2 Severe obesity (Class II) Increased incidence by 136%  >40 kg/m2 Extreme obesity (Class III) Increased incidence by 254%   BMI Readings from Last 4 Encounters:  08/09/16 27.41 kg/m  05/18/16 27.84 kg/m  03/21/16 28.70 kg/m  02/15/16 28.70 kg/m   Wt Readings from Last 4 Encounters:  08/09/16 191 lb (86.6 kg)  05/18/16 194 lb (88 kg)  03/21/16 200 lb (90.7 kg)  02/15/16 200 lb (90.7 kg)  Psych/Mental status: Alert, oriented x 3 (person, place, & time)       Eyes: PERLA Respiratory: No evidence of acute respiratory distress  Cervical Spine Exam  Inspection: No masses, redness, or swelling Alignment: Symmetrical Functional ROM: Unrestricted ROM Stability: No instability detected Muscle strength & Tone: Functionally intact Sensory: Unimpaired Palpation: No palpable anomalies  Upper Extremity (UE) Exam    Side: Right upper extremity  Side: Left upper extremity  Inspection: No masses, redness, swelling, or asymmetry. No contractures  Inspection: No masses, redness, swelling, or asymmetry. No contractures  Functional ROM: Unrestricted ROM          Functional ROM: Unrestricted ROM          Muscle strength & Tone: Functionally intact  Muscle strength & Tone: Functionally intact  Sensory: Unimpaired  Sensory: Unimpaired  Palpation: No palpable anomalies  Palpation: No palpable anomalies  Specialized Test(s): Phalen's (+)  Specialized Test(s): Deferred          Thoracic Spine Exam  Inspection: No masses, redness, or swelling Alignment: Symmetrical Functional ROM: Unrestricted ROM Stability: No instability detected Sensory: Unimpaired Muscle strength & Tone: No palpable anomalies  Lumbar Spine Exam   Inspection: No masses, redness, or swelling Alignment: Symmetrical Functional ROM: Unrestricted ROM Stability: No instability detected Muscle strength & Tone: Functionally intact Sensory: Unimpaired Palpation: No palpable anomalies Provocative Tests: Lumbar Hyperextension and rotation test: evaluation deferred today       Patrick's Maneuver: evaluation deferred today              Gait & Posture Assessment  Ambulation: Unassisted Gait: Relatively normal for age and body habitus Posture: WNL   Lower Extremity Exam    Side: Right lower extremity  Side: Left lower extremity  Inspection: No masses, redness, swelling, or asymmetry. No contractures  Inspection: No masses, redness, swelling, or asymmetry. No contractures  Functional ROM: Unrestricted ROM          Functional ROM: Unrestricted ROM          Muscle strength & Tone: Functionally intact  Muscle strength & Tone: Functionally intact  Sensory: Unimpaired  Sensory: Unimpaired  Palpation: No palpable anomalies  Palpation: No palpable anomalies   Assessment  Primary Diagnosis & Pertinent Problem List: The primary encounter diagnosis was Bilateral carpal tunnel syndrome. 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  Status Diagnosis  Controlled Controlled Controlled 1. Bilateral carpal tunnel syndrome   2. 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 Neurogenic pain   5. Chronic pain syndrome   6. Long term current use of opiate analgesic      Plan of Care  Pharmacotherapy (Medications Ordered): Meds ordered this encounter  Medications  . Oxycodone HCl 10 MG TABS    Sig: Take 1 tablet (10 mg total) by mouth every 8 (eight) hours as  needed.    Dispense:  90 tablet    Refill:  0    Do not place this medication, or any other prescription  from our practice, on "Automatic Refill". Patient may have prescription filled one day early if pharmacy is closed on scheduled refill date. Do not fill until: 08/25/16 To last until: 09/24/16    Order Specific Question:   Supervising Provider    Answer:   Milinda Pointer [phone]  . Oxycodone HCl 10 MG TABS    Sig: Take 1 tablet (10 mg total) by mouth every 8 (eight) hours as needed.    Dispense:  90 tablet    Refill:  0    Do not place this medication, or any other prescription from our practice, on "Automatic Refill". Patient may have prescription filled one day early if pharmacy is closed on scheduled refill date. Do not fill until: 10/24/16 To last until: 11/23/16    Order Specific Question:   Supervising Provider    Answer:   Milinda Pointer [phone]  . Oxycodone HCl 10 MG TABS    Sig: Take 1 tablet (10 mg total) by mouth every 8 (eight) hours as needed.    Dispense:  90 tablet    Refill:  0    Do not place this medication, or any other prescription from our practice, on "Automatic Refill". Patient may have prescription filled one day early if pharmacy is closed on scheduled refill date. Do not fill until: 09/24/16 To last until: 10/24/16    Order Specific Question:   Supervising Provider    Answer:   Milinda Pointer [phone]  . pregabalin (LYRICA) 150 MG capsule    Sig: Take 1 capsule (150 mg total) by mouth 3 (three) times daily.    Dispense:  270 capsule    Refill:  0    Do not place this medication, or any other prescription from our practice, on "Automatic Refill". Patient may have prescription filled one day early if pharmacy is closed on scheduled refill date.    Order Specific Question:   Supervising Provider    Answer:   Milinda Pointer [[id number]]   New Prescriptions   No medications on file   Medications administered today: Mr. Pendry had no  medications administered during this visit. Lab-work, procedure(s), and/or referral(s): No orders of the defined types were placed in this encounter.  Imaging and/or referral(s): None  Interventional therapies: Planned, scheduled, and/or pending:   Not at this time.   Considering:   Diagnostic right-sided Cervical epidural steroid injection under fluoroscopic guidance, with or without sedation.    Palliative PRN treatment(s):   Not at tjis time   Provider-requested follow-up: Return in about 3 months (around 11/08/2016) for Medication Mgmt.  Future Appointments Date Time Provider [place]  11/09/2016 2:00 PM [place], NP [facility] None   Primary Care Physician: Craig Mandes, MD Location: [facility] Outpatient Pain Management Facility Note by: Craig Francois NP Date: 08/09/2016; Time: 3:05 PM  Pain Score Disclaimer: We use the NRS-11 scale. This is a self-reported, subjective measurement of pain severity with only modest accuracy. It is used primarily to identify changes within a particular patient. It must be understood that outpatient pain scales are significantly less accurate that those used for research, where they can be applied under ideal controlled circumstances with minimal exposure to variables. In reality, the score is likely to be a combination of pain intensity and pain affect, where pain affect describes the degree of emotional arousal or changes in action readiness caused by the sensory experience of pain. Factors such as social and work situation, setting, emotional state, anxiety levels, expectation, and  prior pain experience may influence pain perception and show large inter-individual differences that may also be affected by time variables.  Patient instructions provided during this appointment: Patient Instructions   Pain Score  Introduction: The pain score used by this practice is the Verbal Numerical Rating Scale (VNRS-11). This is an 11-point  scale. It is for adults and children 10 years or older. There are significant differences in how the pain score is reported, used, and applied. Forget everything you learned in the past and learn this scoring system.  General Information: The scale should reflect your current level of pain. Unless you are specifically asked for the level of your worst pain, or your average pain. If you are asked for one of these two, then it should be understood that it is over the past 24 hours.  Basic Activities of Daily Living (ADL): Personal hygiene, dressing, eating, transferring, and using restroom.  Instructions: Most patients tend to report their level of pain as a combination of two factors, their physical pain and their psychosocial pain. This last one is also known as "suffering" and it is reflection of how physical pain affects you socially and psychologically. From now on, report them separately. From this point on, when asked to report your pain level, report only your physical pain. Use the following table for reference.  Pain Clinic Pain Levels (0-5/10)  Pain Level Score Description  No Pain 0   Mild pain 1 Nagging, annoying, but does not interfere with basic activities of daily living (ADL). Patients are able to eat, bathe, get dressed, toileting (being able to get on and off the toilet and perform personal hygiene functions), transfer (move in and out of bed or a chair without assistance), and maintain continence (able to control bladder and bowel functions). Blood pressure and heart rate are unaffected. A normal heart rate for a healthy adult ranges from 60 to 100 bpm (beats per minute).   Mild to moderate pain 2 Noticeable and distracting. Impossible to hide from other people. More frequent flare-ups. Still possible to adapt and function close to normal. It can be very annoying and may have occasional stronger flare-ups. With discipline, patients may get used to it and adapt.   Moderate pain 3  Interferes significantly with activities of daily living (ADL). It becomes difficult to feed, bathe, get dressed, get on and off the toilet or to perform personal hygiene functions. Difficult to get in and out of bed or a chair without assistance. Very distracting. With effort, it can be ignored when deeply involved in activities.   Moderately severe pain 4 Impossible to ignore for more than a few minutes. With effort, patients may still be able to manage work or participate in some social activities. Very difficult to concentrate. Signs of autonomic nervous system discharge are evident: dilated pupils (mydriasis); mild sweating (diaphoresis); sleep interference. Heart rate becomes elevated (>115 bpm). Diastolic blood pressure (lower number) rises above 100 mmHg. Patients find relief in laying down and not moving.   Severe pain 5 Intense and extremely unpleasant. Associated with frowning face and frequent crying. Pain overwhelms the senses.  Ability to do any activity or maintain social relationships becomes significantly limited. Conversation becomes difficult. Pacing back and forth is common, as getting into a comfortable position is nearly impossible. Pain wakes you up from deep sleep. Physical signs will be obvious: pupillary dilation; increased sweating; goosebumps; brisk reflexes; cold, clammy hands and feet; nausea, vomiting or dry heaves; loss of appetite; significant sleep  disturbance with inability to fall asleep or to remain asleep. When persistent, significant weight loss is observed due to the complete loss of appetite and sleep deprivation.  Blood pressure and heart rate becomes significantly elevated. Caution: If elevated blood pressure triggers a pounding headache associated with blurred vision, then the patient should immediately seek attention at an urgent or emergency care unit, as these may be signs of an impending stroke.    Emergency Department Pain Levels (6-10/10)  Emergency Room  Pain 6 Severely limiting. Requires emergency care and should not be seen or managed at an outpatient pain management facility. Communication becomes difficult and requires great effort. Assistance to reach the emergency department may be required. Facial flushing and profuse sweating along with potentially dangerous increases in heart rate and blood pressure will be evident.   Distressing pain 7 Self-care is very difficult. Assistance is required to transport, or use restroom. Assistance to reach the emergency department will be required. Tasks requiring coordination, such as bathing and getting dressed become very difficult.   Disabling pain 8 Self-care is no longer possible. At this level, pain is disabling. The individual is unable to do even the most "basic" activities such as walking, eating, bathing, dressing, transferring to a bed, or toileting. Fine motor skills are lost. It is difficult to think clearly.   Incapacitating pain 9 Pain becomes incapacitating. Thought processing is no longer possible. Difficult to remember your own name. Control of movement and coordination are lost.   The worst pain imaginable 10 At this level, most patients pass out from pain. When this level is reached, collapse of the autonomic nervous system occurs, leading to a sudden drop in blood pressure and heart rate. This in turn results in a temporary and dramatic drop in blood flow to the brain, leading to a loss of consciousness. Fainting is one of the body's self defense mechanisms. Passing out puts the brain in a calmed state and causes it to shut down for a while, in order to begin the healing process.    Summary: 1. Refer to this scale when providing [place] with your pain level. 2. Be accurate and careful when reporting your pain level. This will help with your care. 3. Over-reporting your pain level will lead to loss of credibility. 4. Even a level of 1/10 means that there is pain and will be treated at our  facility. 5. High, inaccurate reporting will be documented as "Symptom Exaggeration", leading to loss of credibility and suspicions of possible secondary gains such as obtaining more narcotics, or wanting to appear disabled, for fraudulent reasons. 6. Only pain levels of 5 or below will be seen at our facility. 7. Pain levels of 6 and above will be sent to the Emergency Department and the appointment cancelled. _____________________________________________________________________________________________

## 2016-08-09 NOTE — Progress Notes (Signed)
Nursing Pain Medication Assessment:  Safety precautions to be maintained throughout the outpatient stay will include: orient to surroundings, keep bed in low position, maintain call bell within reach at all times, provide assistance with transfer out of bed and ambulation.  Medication Inspection Compliance: Pill count conducted under aseptic conditions, in front of the patient. Neither the pills nor the bottle was removed from the patient's sight at any time. Once count was completed pills were immediately returned to the patient in their original bottle.  Medication: See above Pill/Patch Count: 51 of 90 pills remain Pill/Patch Appearance: Markings consistent with prescribed medication Bottle Appearance: Standard pharmacy container. Clearly labeled. Filled Date: 04 / 04 / 2018 Last Medication intake:  Today

## 2016-08-09 NOTE — Patient Instructions (Addendum)

## 2016-08-14 ENCOUNTER — Telehealth: Payer: Self-pay

## 2016-08-14 DIAGNOSIS — K5903 Drug induced constipation: Secondary | ICD-10-CM

## 2016-08-14 DIAGNOSIS — T402X5A Adverse effect of other opioids, initial encounter: Principal | ICD-10-CM

## 2016-08-14 MED ORDER — NALOXEGOL OXALATE 25 MG PO TABS: 25.0000 mg | ORAL_TABLET | Freq: Every day | ORAL | 0 refills | Status: DC

## 2016-08-14 NOTE — Telephone Encounter (Signed)
Pt says he was here last week and his movantik was never sent to the pharmacy pt says he is about to run out on 4/25 please call pt

## 2016-11-09 ENCOUNTER — Ambulatory Visit: Payer: Medicare HMO | Attending: Nurse Practitioner | Admitting: Nurse Practitioner

## 2016-11-09 ENCOUNTER — Encounter: Payer: Self-pay | Admitting: Nurse Practitioner

## 2016-11-09 VITALS — BP 143/93 | HR 112 | Temp 98.0°F | Resp 18 | Ht 70.0 in | Wt 195.0 lb

## 2016-11-09 DIAGNOSIS — R51 Headache: Secondary | ICD-10-CM | POA: Insufficient documentation

## 2016-11-09 DIAGNOSIS — K219 Gastro-esophageal reflux disease without esophagitis: Secondary | ICD-10-CM | POA: Diagnosis not present

## 2016-11-09 DIAGNOSIS — Z79899 Other long term (current) drug therapy: Secondary | ICD-10-CM | POA: Insufficient documentation

## 2016-11-09 DIAGNOSIS — M791 Myalgia: Secondary | ICD-10-CM | POA: Diagnosis not present

## 2016-11-09 DIAGNOSIS — Z7982 Long term (current) use of aspirin: Secondary | ICD-10-CM | POA: Insufficient documentation

## 2016-11-09 DIAGNOSIS — Z809 Family history of malignant neoplasm, unspecified: Secondary | ICD-10-CM | POA: Insufficient documentation

## 2016-11-09 DIAGNOSIS — I10 Essential (primary) hypertension: Secondary | ICD-10-CM | POA: Diagnosis not present

## 2016-11-09 DIAGNOSIS — G8929 Other chronic pain: Secondary | ICD-10-CM

## 2016-11-09 DIAGNOSIS — Z5181 Encounter for therapeutic drug level monitoring: Secondary | ICD-10-CM | POA: Insufficient documentation

## 2016-11-09 DIAGNOSIS — Z86010 Personal history of colonic polyps: Secondary | ICD-10-CM | POA: Insufficient documentation

## 2016-11-09 DIAGNOSIS — E559 Vitamin D deficiency, unspecified: Secondary | ICD-10-CM | POA: Insufficient documentation

## 2016-11-09 DIAGNOSIS — Z87891 Personal history of nicotine dependence: Secondary | ICD-10-CM | POA: Insufficient documentation

## 2016-11-09 DIAGNOSIS — E782 Mixed hyperlipidemia: Secondary | ICD-10-CM | POA: Insufficient documentation

## 2016-11-09 DIAGNOSIS — G5603 Carpal tunnel syndrome, bilateral upper limbs: Secondary | ICD-10-CM | POA: Insufficient documentation

## 2016-11-09 DIAGNOSIS — Z811 Family history of alcohol abuse and dependence: Secondary | ICD-10-CM | POA: Insufficient documentation

## 2016-11-09 DIAGNOSIS — M792 Neuralgia and neuritis, unspecified: Secondary | ICD-10-CM

## 2016-11-09 DIAGNOSIS — K5903 Drug induced constipation: Secondary | ICD-10-CM | POA: Insufficient documentation

## 2016-11-09 DIAGNOSIS — R5383 Other fatigue: Secondary | ICD-10-CM | POA: Diagnosis not present

## 2016-11-09 DIAGNOSIS — M4802 Spinal stenosis, cervical region: Secondary | ICD-10-CM | POA: Insufficient documentation

## 2016-11-09 DIAGNOSIS — M7712 Lateral epicondylitis, left elbow: Secondary | ICD-10-CM | POA: Insufficient documentation

## 2016-11-09 DIAGNOSIS — G894 Chronic pain syndrome: Secondary | ICD-10-CM

## 2016-11-09 DIAGNOSIS — Z87442 Personal history of urinary calculi: Secondary | ICD-10-CM | POA: Diagnosis not present

## 2016-11-09 DIAGNOSIS — G479 Sleep disorder, unspecified: Secondary | ICD-10-CM | POA: Insufficient documentation

## 2016-11-09 DIAGNOSIS — E291 Testicular hypofunction: Secondary | ICD-10-CM | POA: Insufficient documentation

## 2016-11-09 DIAGNOSIS — Z79891 Long term (current) use of opiate analgesic: Secondary | ICD-10-CM | POA: Insufficient documentation

## 2016-11-09 DIAGNOSIS — M961 Postlaminectomy syndrome, not elsewhere classified: Secondary | ICD-10-CM | POA: Diagnosis not present

## 2016-11-09 DIAGNOSIS — G629 Polyneuropathy, unspecified: Secondary | ICD-10-CM | POA: Diagnosis not present

## 2016-11-09 DIAGNOSIS — G540 Brachial plexus disorders: Secondary | ICD-10-CM | POA: Diagnosis not present

## 2016-11-09 DIAGNOSIS — R413 Other amnesia: Secondary | ICD-10-CM | POA: Diagnosis not present

## 2016-11-09 DIAGNOSIS — M1990 Unspecified osteoarthritis, unspecified site: Secondary | ICD-10-CM | POA: Insufficient documentation

## 2016-11-09 DIAGNOSIS — M545 Low back pain, unspecified: Secondary | ICD-10-CM | POA: Insufficient documentation

## 2016-11-09 DIAGNOSIS — T402X5A Adverse effect of other opioids, initial encounter: Secondary | ICD-10-CM | POA: Insufficient documentation

## 2016-11-09 DIAGNOSIS — M79671 Pain in right foot: Secondary | ICD-10-CM | POA: Insufficient documentation

## 2016-11-09 MED ORDER — PREGABALIN 150 MG PO CAPS: 150.0000 mg | ORAL_CAPSULE | Freq: Three times a day (TID) | ORAL | 0 refills | Status: DC

## 2016-11-09 MED ORDER — LIDOCAINE 5 % EX OINT: 1.0000 "application " | TOPICAL_OINTMENT | Freq: Four times a day (QID) | CUTANEOUS | 2 refills | Status: AC | PRN

## 2016-11-09 MED ORDER — OXYCODONE HCL 10 MG PO TABS: 10.0000 mg | ORAL_TABLET | Freq: Three times a day (TID) | ORAL | 0 refills | Status: DC | PRN

## 2016-11-09 NOTE — Progress Notes (Signed)
Patient's Name: Craig Alvarado  MRN: [id number]  Referring Provider: Fritzi Mandes, MD  DOB: 10/27/1945  PCP: Fritzi Mandes, MD  DOS: 11/09/2016  Note by: Vevelyn Francois NP  Service setting: Ambulatory outpatient  Specialty: Interventional Pain Management  Location: [facility] (AMB) Pain Management Facility    Patient type: Established    Primary Reason(s) for Visit: Encounter for prescription drug management. (Level of risk: moderate)  CC: Carpal Tunnel (bilaterally)  HPI  Mr. Bourke is a 71 y.o. year old, male patient, who comes today for a medication management evaluation. 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[place]); Encounter for therapeutic drug level monitoring; Routine history and physical examination of adult; Opioid dependence ([place]); Constipation due to opioid therapy; Chronic cervical radicular pain (Right); 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[facility] 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); Illicit drug use; Marijuana use; Neck pain; Chronic upper extremity pain (Right); Chronic shoulder pain (Right); Low testosterone; Lower urinary tract symptoms (LUTS); Headache; Vitamin D insufficiency; Risk for falls; Abnormal CT scan, cervical spine (pre-surgical) (05/22/2002); 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 His primarily concern today is the Carpal Tunnel (bilaterally)  Pain Assessment: Location: Left, Right Hand Radiating: carpel tunnel Onset: More than a month ago Duration: Chronic pain Quality: Constant Severity: 2 /10 (self-reported pain score)  Note: Reported level is compatible with observation.                   Effect on ADL:   Timing: Constant Modifying factors: medication  Mr. Smitherman was last scheduled for an appointment on 08/09/2016 for medication management. During today's appointment we reviewed Mr. Vandenberghe chronic pain status, as well as his outpatient medication regimen. He admits that he did have CTR on his right wrist and he admits that it has not helped his pain.  The patient  reports that he does not use drugs. His body mass index is 27.98 kg/m.  Further details on both, my assessment(s), as well as the proposed treatment plan, please see below.  Controlled Substance Pharmacotherapy Assessment REMS (Risk Evaluation and Mitigation Strategy)  Analgesic: Oxycodone 39m threee times a day (315mday) MME/day:[id number] [place]Hart [place]  11/09/2016  3:20 PM  Sign at close encounter Nursing Pain Medication Assessment:  Safety precautions to be maintained throughout the outpatient stay will include: orient to surroundings, keep bed in low position, maintain call bell within reach at all times, provide assistance with transfer out of bed and ambulation.  Medication Inspection Compliance: Mr. PitCunyd not comply with our request to bring his pills to be counted. He was reminded that bringing the medication bottles, even when empty, is a requirement.  Medication: None brought in. Pill/Patch Count: None available to be counted. Bottle Appearance: No container available. Did not bring bottle(s) to appointment. Filled Date: N/A Last Medication intake:  Today   Pharmacokinetics: Liberation and absorption (onset of action): WNL Distribution (time to peak  effect): WNL  Metabolism and excretion (duration of action): WNL         Pharmacodynamics: Desired effects: Analgesia: Mr. Roddey reports >50% benefit. Functional ability: Patient reports that medication allows him to accomplish basic ADLs Clinically meaningful improvement in function (CMIF): Sustained CMIF goals met Perceived effectiveness: Described as relatively effective, allowing for increase in activities of daily living (ADL) Undesirable effects: Side-effects or Adverse reactions: None reported Monitoring: [place] PMP: Online review of the past 31-monthperiod conducted. Compliant with practice rules and regulations List of all UDS test(s) done:  Lab Results  Component Value Date   TOXASSSELUR FINAL 05/18/2016   [place]FINAL 03/21/2016   [place]DermottFINAL 11/18/2015   [place] HillFINAL 09/16/2015   TOXASSSELUR FINAL 05/24/2015   TOXASSSELUR FINAL 03/29/2015   Last UDS on record: ToxAssure Select 13  Date Value Ref Range Status  05/18/2016 FINAL  Final    Comment:    ==================================================================== TOXASSURE SELECT 13 (MW) ==================================================================== Test                             Result       Flag       Units Drug Present and Declared for Prescription Verification   Oxycodone                      904          EXPECTED   ng/mg creat   Oxymorphone                    4128         EXPECTED   ng/mg creat   Noroxycodone                   2544         EXPECTED   ng/mg creat   Noroxymorphone                 1142         EXPECTED   ng/mg creat    Sources of oxycodone are scheduled prescription medications.    Oxymorphone, noroxycodone, and noroxymorphone are expected    metabolites of oxycodone. Oxymorphone is also available as a    scheduled prescription medication. ==================================================================== Test                      Result    Flag   Units      Ref Range    Creatinine              72               mg/dL      >=20 ==================================================================== Declared Medications:  The flagging and interpretation on this report are based on the  following declared medications.  Unexpected results may arise from  inaccuracies in the declared medications.  **Note: The testing scope of this panel includes these medications:  Oxycodone  **Note: The testing scope of this panel does not include following  reported medications:  Aspirin (Aspirin 81)  Atorvastatin (Lipitor)  Docusate (Colace)  Finasteride (Proscar)  Lubiprostone (Amitiza)  Magnesium (Mag-Ox)  Naloxegol (Movantik)  Pregabalin (Lyrica)  Promethazine  Sumatriptan  Supplement  Tamsulosin (Flomax)  Topiramate (Topamax)  Vitamin D3 ==================================================================== For clinical consultation, please call [phone] ====================================================================    UDS interpretation: Compliant          Medication Assessment Form: Reviewed. Patient indicates being  compliant with therapy Treatment compliance: Compliant Risk Assessment Profile: Aberrant behavior: failure to bring medications for pill counts Discussed with pt previous phone call from outside provider     With concerns of patients medication use and sharing of medication; Benzodiazepine Comorbid factors increasing risk of overdose: See prior notes. No additional risks detected today Risk of substance use disorder (SUD): Moderate-to-High Opioid Risk Tool (ORT) Total Score: 7  Interpretation Table:  Score <3 = Low Risk for SUD  Score between 4-7 = Moderate Risk for SUD  Score >8 = High Risk for Opioid Abuse   Risk Mitigation Strategies:  Patient Counseling: Covered Patient-Prescriber Agreement (PPA): Present and active  Notification to other healthcare providers: Done  Pharmacologic Plan: No change in therapy, at this  time  Laboratory Chemistry  Inflammation Markers (CRP: Acute Phase) (ESR: Chronic Phase) Lab Results  Component Value Date   CRP <0.8 03/21/2016   ESRSEDRATE 13 03/21/2016                 Renal Function Markers Lab Results  Component Value Date   BUN 23 (H) 03/21/2016   CREATININE 1.09 03/21/2016   GFRAA >60 03/21/2016   GFRNONAA >60 03/21/2016                 Hepatic Function Markers Lab Results  Component Value Date   AST 21 03/21/2016   ALT 16 (L) 03/21/2016   ALBUMIN 4.0 03/21/2016   ALKPHOS 56 03/21/2016                 Electrolytes Lab Results  Component Value Date   NA 139 03/21/2016   K 4.1 03/21/2016   CL 108 03/21/2016   CALCIUM 8.7 (L) 03/21/2016   MG 2.2 03/21/2016                 Neuropathy Markers Lab Results  Component Value Date   VITAMINB12 658 03/21/2016                 Bone Pathology Markers Lab Results  Component Value Date   ALKPHOS 56 03/21/2016   25OHVITD1 41 03/21/2016   25OHVITD2 2.1 03/21/2016   25OHVITD3 39 03/21/2016   CALCIUM 8.7 (L) 03/21/2016                 Coagulation Parameters No results found for: INR, LABPROT, APTT, PLT               Cardiovascular Markers No results found for: BNP, HGB, HCT               Note: Lab results reviewed.  Recent Diagnostic Imaging Review  Dg Cervical Spine Complete  Result Date: 03/21/2016 CLINICAL DATA:  Chronic neck and shoulder pain since cervical fusion in 2001 EXAM: [place] 4+ VIEW COMPARISON:  None. FINDINGS: Plate and screw fixation device from anterior fusion noted at C4-5. Fusion across the C5-6 and C6-7 disc spaces as well. Normal alignment. Degenerative disc disease at C3-4 with disc space narrowing and spurring. No fracture. Prevertebral soft tissues are normal. IMPRESSION: Fusion changes from C4-C7 with anterior plate at Z6-1. No acute bony abnormality. Electronically Signed   By: Rolm Baptise M.D.   On: 03/21/2016 14:37   Dg Shoulder Right  Result  Date: 03/21/2016 CLINICAL DATA:  Chronic right shoulder pain, no known injury, initial encounter EXAM: RIGHT SHOULDER - 2+ VIEW COMPARISON:  None. FINDINGS: There is no evidence of fracture or dislocation. There is no evidence of  arthropathy or other focal bone abnormality. Soft tissues are unremarkable. IMPRESSION: No acute abnormality noted. Electronically Signed   By: Inez Catalina M.D.   On: 03/21/2016 14:38   Note: Imaging results reviewed.          Meds   Current Meds  Medication Sig  . aspirin 81 MG chewable tablet Chew 81 mg by mouth.  Marland Kitchen atorvastatin (LIPITOR) 40 MG tablet Take 40 mg by mouth daily at 6 PM.   . Cholecalciferol (VITAMIN D3) 2000 units capsule Take 1 capsule (2,000 Units total) by mouth daily.  Marland Kitchen docusate sodium (STOOL SOFTENER) 100 MG capsule Take by mouth.  . finasteride (PROSCAR) 5 MG tablet TAKE 1 TABLET BY MOUTH  DAILY  . magnesium oxide (MAG-OX) 400 MG tablet Take 1 tablet (400 mg total) by mouth daily.  . naloxegol oxalate (MOVANTIK) 25 MG TABS tablet Take 1 tablet (25 mg total) by mouth daily. Take on an empty stomach at least 1 hour before or 2 hours after a meal.  . Naloxone HCl (NARCAN) 4 MG/0.1ML LIQD Place 1 spray into the nose once. Spray half of bottle content into each nostril, then call 911  . [START ON 11/23/2016] Oxycodone HCl 10 MG TABS Take 1 tablet (10 mg total) by mouth every 8 (eight) hours as needed.  . promethazine (PHENERGAN) 25 MG tablet Take 25 mg by mouth every 6 (six) hours as needed for nausea or vomiting.  . SUMAtriptan (IMITREX) 50 MG tablet TAKE ONE TABLET BY MOUTH ONCE DAILY AS NEEDED FOR  MIGRAINE.  Marland Kitchen tamsulosin (FLOMAX) 0.4 MG CAPS capsule Take 0.4 mg by mouth daily.  Marland Kitchen topiramate (TOPAMAX) 50 MG tablet Take 50 mg by mouth daily.   . Wheat Dextrin (BENEFIBER) POWD Stir 2 tsp. TID into 4-8 oz of any non-carbonated beverage or soft food (hot or cold)  . [DISCONTINUED] Oxycodone HCl 10 MG TABS Take 1 tablet (10 mg total) by mouth every 8  (eight) hours as needed.    ROS  Constitutional: Denies any fever or chills Gastrointestinal: No reported hemesis, hematochezia, vomiting, or acute GI distress Musculoskeletal: Denies any acute onset joint swelling, redness, loss of ROM, or weakness Neurological: No reported episodes of acute onset apraxia, aphasia, dysarthria, agnosia, amnesia, paralysis, loss of coordination, or loss of consciousness  Allergies  Mr. Hazen has No Known Allergies.  PFSH  Drug: Mr. Dulay  reports that he does not use drugs. Alcohol:  reports that he drinks alcohol. Tobacco:  reports that he quit smoking about 9 years ago. He has never used smokeless tobacco. 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 Surgical: Mr. Lorenson  has a past surgical history that includes Spine surgery. Family: family history includes Alcohol abuse in his father; Alzheimer's disease in his mother; Cancer in his father.  Constitutional Exam  General appearance: Well nourished, well developed, and well hydrated. In no apparent acute distress Vitals:   11/09/16 1510  BP: (!) 143/93  Pulse: (!) 112  Resp: 18  Temp: 98 F (36.7 C)  TempSrc: Oral  SpO2: 96%  Weight: 195 lb (88.5 kg)  Height: 5' 10"  (1.778 m)   BMI Assessment: Estimated body mass index is 27.98  kg/m as calculated from the following:   Height as of this encounter: 5' 10"  (1.778 m).   Weight as of this encounter: 195 lb (88.5 kg).  BMI interpretation table: BMI level Category Range  association with higher incidence of chronic pain  <18 kg/m2 Underweight   18.5-24.9 kg/m2 Ideal body weight   25-29.9 kg/m2 Overweight Increased incidence by 20%  30-34.9 kg/m2 Obese (Class I) Increased incidence by 68%  35-39.9 kg/m2 Severe obesity (Class II) Increased incidence by 136%  >40 kg/m2 Extreme obesity (Class III) Increased incidence by 254%   BMI Readings from Last 4 Encounters:  11/09/16 27.98 kg/m  08/09/16 27.41 kg/m  05/18/16 27.84 kg/m  03/21/16 28.70 kg/m   Wt Readings from Last 4 Encounters:  11/09/16 195 lb (88.5 kg)  08/09/16 191 lb (86.6 kg)  05/18/16 194 lb (88 kg)  03/21/16 200 lb (90.7 kg)  Psych/Mental status: Alert, oriented x 3 (person, place, & time)       Eyes: PERLA Respiratory: No evidence of acute respiratory distress  Cervical Spine Exam  Inspection: No masses, redness, or swelling Alignment: Symmetrical Functional ROM: Unrestricted ROM      Stability: No instability detected Muscle strength & Tone: Functionally intact Sensory: Unimpaired Palpation: No palpable anomalies              Upper Extremity (UE) Exam    Side: Right upper extremity  Side: Left upper extremity  Inspection: No masses, redness, swelling, or asymmetry. No contractures  Inspection: incision from previous surgery   Functional ROM: Unrestricted ROM          Functional ROM: Unrestricted ROM          Muscle strength & Tone: Functionally intact  Muscle strength & Tone: Functionally intact  Sensory: Unimpaired  Sensory: Unimpaired  Palpation: No palpable anomalies              Palpation: No palpable anomalies              Specialized Test(s): Deferred         Specialized Test(s): Deferred          Thoracic Spine Exam  Inspection: No masses, redness, or swelling Alignment: Symmetrical Functional ROM: Unrestricted ROM Stability: No instability detected Sensory: Unimpaired Muscle strength & Tone: No palpable anomalies  Lumbar Spine Exam  Inspection: No masses,  redness, or swelling Alignment: Symmetrical Functional ROM: Unrestricted ROM      Stability: No instability detected Muscle strength & Tone: Functionally intact Sensory: Unimpaired Palpation: No palpable anomalies       Provocative Tests: Lumbar Hyperextension and rotation test: evaluation deferred today       Lumbar Lateral bending test: evaluation deferred today       Patrick's Maneuver: evaluation deferred today                    Gait & Posture Assessment  Ambulation: Unassisted Gait: Relatively normal for age and body habitus Posture: WNL   Lower Extremity Exam    Side: Right lower extremity  Side: Left lower extremity  Inspection: No masses, redness, swelling, or asymmetry. No contractures  Inspection: No masses, redness, swelling, or asymmetry. No contractures  Functional ROM: Unrestricted ROM          Functional ROM: Unrestricted ROM          Muscle strength & Tone: Functionally intact  Muscle strength & Tone: Functionally intact  Sensory: Unimpaired  Sensory: Unimpaired  Palpation: No palpable anomalies  Palpation:  No palpable anomalies   Assessment  Primary Diagnosis & Pertinent Problem List: The primary encounter diagnosis was Bilateral carpal tunnel syndrome. Diagnoses of Chronic low back pain, unspecified back pain laterality, with sciatica presence unspecified, Chronic pain syndrome, Long term current use of opiate analgesic, and Neurogenic pain were also pertinent to this visit.  Status Diagnosis  Persistent Controlled Controlled 1. Bilateral carpal tunnel syndrome   2. Chronic low back pain, unspecified back pain laterality, with sciatica presence unspecified   3. Chronic pain syndrome   4. Long term current use of opiate analgesic   5. Neurogenic pain     Problems updated and reviewed during this visit: No problems updated. Plan of Care  Pharmacotherapy (Medications Ordered): Meds ordered this encounter  Medications  . pregabalin (LYRICA) 150 MG capsule     Sig: Take 1 capsule (150 mg total) by mouth 3 (three) times daily.    Dispense:  270 capsule    Refill:  0    Do not place this medication, or any other prescription from our practice, on "Automatic Refill". Patient may have prescription filled one day early if pharmacy is closed on scheduled refill date.    Order Specific Question:   Supervising Provider    Answer:   Milinda Pointer [phone]  . Oxycodone HCl 10 MG TABS    Sig: Take 1 tablet (10 mg total) by mouth every 8 (eight) hours as needed.    Dispense:  90 tablet    Refill:  0    Do not place this medication, or any other prescription from our practice, on "Automatic Refill". Patient may have prescription filled one day early if pharmacy is closed on scheduled refill date. Do not fill until: 10/24/16 To last until: 11/23/16    Order Specific Question:   Supervising Provider    Answer:   Milinda Pointer [phone]  . lidocaine (XYLOCAINE) 5 % ointment    Sig: Apply 1 application topically 4 (four) times daily as needed for moderate pain.    Dispense:  35.44 g    Refill:  2    Maximum dose: 5 g/application (approximately 6 inches of ointment); 20 g/day    Order Specific Question:   Supervising Provider    Answer:   Milinda Pointer [phone]   New Prescriptions   LIDOCAINE (XYLOCAINE) 5 % OINTMENT    Apply 1 application topically 4 (four) times daily as needed for moderate pain.   Medications administered today: Mr. Schollmeyer had no medications administered during this visit. Lab-work, procedure(s), and/or referral(s): Orders Placed This Encounter  Procedures  . ToxASSURE Select 13 (MW), Urine  . Ambulatory referral to Physical Therapy   Imaging and/or referral(s): AMB REFERRAL TO PHYSICAL THERAPY  Interventional therapies: Planned, scheduled, and/or pending:   Pt was offered additional treatment as indicated above.    Considering:   Diagnostic right-sided Cervical epidural steroid injection     Palliative PRN  treatment(s):   Not at this time   Provider-requested follow-up: No Follow-up on file.  Future Appointments Date Time Provider [place]  12/07/2016 1:45 PM Vevelyn Francois, NP [facility] None   Primary Care Physician: Fritzi Mandes, MD Location: [facility] Out[facility] Management [facility] Note by: Vevelyn Francois NP Date: 11/09/2016; Time: 1:13 PM  Pain Score Disclaimer: We use the NRS-11 scale. This is a self-reported, subjective measurement of pain severity with only modest accuracy. It is used primarily to identify changes within a particular patient. It must be understood that outpatient pain  scales are significantly less accurate that those used for research, where they can be applied under ideal controlled circumstances with minimal exposure to variables. In reality, the score is likely to be a combination of pain intensity and pain affect, where pain affect describes the degree of emotional arousal or changes in action readiness caused by the sensory experience of pain. Factors such as social and work situation, setting, emotional state, anxiety levels, expectation, and prior pain experience may influence pain perception and show large inter-individual differences that may also be affected by time variables.  Patient instructions provided during this appointment: Patient Instructions   ____________________________________________________________________________________________  Medication Rules  Applies to: All patients receiving prescriptions (written or electronic).  Pharmacy of record: Pharmacy where electronic prescriptions will be sent. If written prescriptions are taken to a different pharmacy, please inform the nursing staff. The pharmacy listed in the electronic medical record should be the one where you would like electronic prescriptions to be sent.  Prescription refills: Only during scheduled appointments. Applies to both, written and electronic  prescriptions.  NOTE: The following applies primarily to controlled substances (Opioid* Pain Medications).   Patient's responsibilities: 1. Pain Pills: Bring all pain pills to every appointment (except for procedure appointments). 2. Pill Bottles: Bring pills in original pharmacy bottle. Always bring newest bottle. Bring bottle, even if empty. 3. Medication refills: You are responsible for knowing and keeping track of what medications you need refilled. The day before your appointment, write a list of all prescriptions that need to be refilled. Bring that list to your appointment and give it to the admitting nurse. Prescriptions will be written only during appointments. If you forget a medication, it will not be "Called in", "Faxed", or "electronically sent". You will need to get another appointment to get these prescribed. 4. Prescription Accuracy: You are responsible for carefully inspecting your prescriptions before leaving our office. Have the discharge nurse carefully go over each prescription with you, before taking them home. Make sure that your name is accurately spelled, that your address is correct. Check the name and dose of your medication to make sure it is accurate. Check the number of pills, and the written instructions to make sure they are clear and accurate. Make sure that you are given enough medication to last until your next medication refill appointment. 5. Taking Medication: Take medication as prescribed. Never take more pills than instructed. Never take medication more frequently than prescribed. Taking less pills or less frequently is permitted and encouraged, when it comes to controlled substances (written prescriptions).  6. Inform other Doctors: Always inform, all of your healthcare providers, of all the medications you take. 7. Pain Medication from other Providers: You are not allowed to accept any additional pain medication from any other Doctor or Healthcare provider. There  are two exceptions to this rule. (see below) In the event that you require additional pain medication, you are responsible for notifying us, as stated below. 8. Medication Agreement: You are responsible for carefully reading and following our Medication Agreement. This must be signed before receiving any prescriptions from our practice. Safely store a copy of your signed Agreement. Violations to the Agreement will result in no further prescriptions. (Additional copies of our Medication Agreement are available upon request.) 9. Laws, Rules, & Regulations: All patients are expected to follow all Federal and [employer], [employer], Rules, [employer]. Ignorance of the Laws does not constitute a valid excuse. The use of any illegal substances is prohibited. 10. Adopted CDC guidelines &  recommendations: Target dosing levels will be at or below 60 MME/day. Use of benzodiazepines** is not recommended.  Exceptions: There are only two exceptions to the rule of not receiving pain medications from other Healthcare Providers. 1. Exception #1 (Emergencies): In the event of an emergency (i.e.: accident requiring emergency care), you are allowed to receive additional pain medication. However, you are responsible for: As soon as you are able, call our office [phone], at any time of the day or night, and leave a message stating your name, the date and nature of the emergency, and the name and dose of the medication prescribed. In the event that your call is answered by a member of our staff, make sure to document and save the date, time, and the name of the person that took your information.  2. Exception #2 (Planned Surgery): In the event that you are scheduled by another doctor or dentist to have any type of surgery or procedure, you are allowed (for a period no longer than 30 days), to receive additional pain medication, for the acute post-op pain. However, in this case, you are responsible for picking up a copy of  our "Post-op Pain Management for Surgeons" handout, and giving it to your surgeon or dentist. This document is available at our office, and does not require an appointment to obtain it. Simply go to our office during business hours (Monday-Thursday from 8:00 AM to 4:00 PM) (Friday 8:00 AM to 12:00 Noon) or if you have a scheduled appointment with [place], prior to your surgery, and ask for it by name. In addition, you will need to provide [place] with your name, name of your surgeon, type of surgery, and date of procedure or surgery.  *Opioid medications include: morphine, codeine, oxycodone, oxymorphone, hydrocodone, hydromorphone, meperidine, tramadol, tapentadol, buprenorphine, fentanyl, methadone. **Benzodiazepine medications include: diazepam (Valium), alprazolam (Xanax), clonazepam (Klonopine), lorazepam (Ativan), clorazepate (Tranxene), chlordiazepoxide (Librium), estazolam (Prosom), oxazepam (Serax), temazepam (Restoril), triazolam (Halcion)  ____________________________________________________________________________________________

## 2016-11-09 NOTE — Patient Instructions (Addendum)

## 2016-11-09 NOTE — Progress Notes (Signed)
Nursing Pain Medication Assessment:  Safety precautions to be maintained throughout the outpatient stay will include: orient to surroundings, keep bed in low position, maintain call bell within reach at all times, provide assistance with transfer out of bed and ambulation.  Medication Inspection Compliance: Craig Alvarado did not comply with our request to bring his pills to be counted. He was reminded that bringing the medication bottles, even when empty, is a requirement.  Medication: None brought in. Pill/Patch Count: None available to be counted. Bottle Appearance: No container available. Did not bring bottle(s) to appointment. Filled Date: N/A Last Medication intake:  Today

## 2016-11-16 ENCOUNTER — Encounter: Payer: Self-pay | Admitting: Nurse Practitioner

## 2016-11-16 LAB — TOXASSURE SELECT 13 (MW), URINE

## 2016-11-16 NOTE — Progress Notes (Signed)
BNZO-UNLISTED NOTE: This forensic urine drug screen (UDS) test was conducted using a state-of-the-art ultra high performance liquid chromatography and mass spectrometry system (UPLC/MS-MS), the most sophisticated and accurate method available. UPLC/MS-MS is 1,000 times more precise and accurate than standard gas chromatography and mass spectrometry (GC/MS). This system can analyze 26 drug categories and 180 drug compounds.  An unreported benzodiazepine was detected in the sample. CDC reports have identified the combination of opioids and benzodiazepines (Valium, Ativan, Xanax, Librium, Tranxene, Klonopin, Dalmane, Halcion,Restoril, etc.) to be associated in a significant number of reported drug-to-drug interactions leading to accidental overdosing leading to respiratory failure and death. 

## 2016-11-23 ENCOUNTER — Telehealth: Payer: Self-pay | Admitting: *Deleted

## 2016-11-23 NOTE — Telephone Encounter (Signed)
Spoke with Mr Craig Alvarado and told him that with the dates 10/24/16 to last until 11/23/16 should be fine to fill and does appear that the Rx that was written on 11/09/16 inadvertantly had the wrong dates on it.  Mr Craig Alvarado also ask to refill his Movantik which was last prescribed in April.  I explained that Crystal was not in the office but would return on Monday and I would send an inbox to her to see if she could in fact phone this in.  Patient verbalizes u/o and I did speak with Apolonio Schneiders at [facility] in [place] re; these issues.

## 2016-11-28 ENCOUNTER — Other Ambulatory Visit: Payer: Self-pay | Admitting: Nurse Practitioner

## 2016-11-28 DIAGNOSIS — T402X5A Adverse effect of other opioids, initial encounter: Principal | ICD-10-CM

## 2016-11-28 DIAGNOSIS — K5903 Drug induced constipation: Secondary | ICD-10-CM

## 2016-11-28 MED ORDER — NALOXEGOL OXALATE 25 MG PO TABS: 25.0000 mg | ORAL_TABLET | Freq: Every day | ORAL | 0 refills | Status: DC

## 2016-12-07 ENCOUNTER — Ambulatory Visit: Payer: Medicare HMO | Attending: Nurse Practitioner | Admitting: Nurse Practitioner

## 2016-12-07 ENCOUNTER — Encounter: Payer: Self-pay | Admitting: Nurse Practitioner

## 2016-12-07 VITALS — BP 132/88 | HR 50 | Temp 98.3°F | Resp 20 | Ht 70.0 in | Wt 198.2 lb

## 2016-12-07 DIAGNOSIS — K5900 Constipation, unspecified: Secondary | ICD-10-CM | POA: Insufficient documentation

## 2016-12-07 DIAGNOSIS — Z87891 Personal history of nicotine dependence: Secondary | ICD-10-CM | POA: Insufficient documentation

## 2016-12-07 DIAGNOSIS — Z1211 Encounter for screening for malignant neoplasm of colon: Secondary | ICD-10-CM | POA: Insufficient documentation

## 2016-12-07 DIAGNOSIS — K5903 Drug induced constipation: Secondary | ICD-10-CM

## 2016-12-07 DIAGNOSIS — E782 Mixed hyperlipidemia: Secondary | ICD-10-CM | POA: Insufficient documentation

## 2016-12-07 DIAGNOSIS — I10 Essential (primary) hypertension: Secondary | ICD-10-CM | POA: Insufficient documentation

## 2016-12-07 DIAGNOSIS — G894 Chronic pain syndrome: Secondary | ICD-10-CM | POA: Diagnosis not present

## 2016-12-07 DIAGNOSIS — R51 Headache: Secondary | ICD-10-CM | POA: Diagnosis not present

## 2016-12-07 DIAGNOSIS — Z87442 Personal history of urinary calculi: Secondary | ICD-10-CM | POA: Diagnosis not present

## 2016-12-07 DIAGNOSIS — M79671 Pain in right foot: Secondary | ICD-10-CM | POA: Diagnosis not present

## 2016-12-07 DIAGNOSIS — M79601 Pain in right arm: Secondary | ICD-10-CM | POA: Diagnosis not present

## 2016-12-07 DIAGNOSIS — M79602 Pain in left arm: Secondary | ICD-10-CM | POA: Insufficient documentation

## 2016-12-07 DIAGNOSIS — G8929 Other chronic pain: Secondary | ICD-10-CM | POA: Diagnosis not present

## 2016-12-07 DIAGNOSIS — E559 Vitamin D deficiency, unspecified: Secondary | ICD-10-CM | POA: Insufficient documentation

## 2016-12-07 DIAGNOSIS — M792 Neuralgia and neuritis, unspecified: Secondary | ICD-10-CM | POA: Diagnosis not present

## 2016-12-07 DIAGNOSIS — M5412 Radiculopathy, cervical region: Secondary | ICD-10-CM | POA: Diagnosis not present

## 2016-12-07 DIAGNOSIS — Z5181 Encounter for therapeutic drug level monitoring: Secondary | ICD-10-CM | POA: Diagnosis not present

## 2016-12-07 DIAGNOSIS — Z79891 Long term (current) use of opiate analgesic: Secondary | ICD-10-CM | POA: Diagnosis not present

## 2016-12-07 DIAGNOSIS — E7801 Familial hypercholesterolemia: Secondary | ICD-10-CM | POA: Insufficient documentation

## 2016-12-07 DIAGNOSIS — F1290 Cannabis use, unspecified, uncomplicated: Secondary | ICD-10-CM | POA: Diagnosis not present

## 2016-12-07 DIAGNOSIS — Z86010 Personal history of colonic polyps: Secondary | ICD-10-CM | POA: Insufficient documentation

## 2016-12-07 DIAGNOSIS — M4802 Spinal stenosis, cervical region: Secondary | ICD-10-CM | POA: Insufficient documentation

## 2016-12-07 DIAGNOSIS — M25511 Pain in right shoulder: Secondary | ICD-10-CM | POA: Insufficient documentation

## 2016-12-07 DIAGNOSIS — T402X5A Adverse effect of other opioids, initial encounter: Secondary | ICD-10-CM | POA: Insufficient documentation

## 2016-12-07 DIAGNOSIS — G5603 Carpal tunnel syndrome, bilateral upper limbs: Secondary | ICD-10-CM | POA: Diagnosis not present

## 2016-12-07 MED ORDER — NALOXEGOL OXALATE 25 MG PO TABS: 25.0000 mg | ORAL_TABLET | Freq: Every day | ORAL | 0 refills | Status: DC

## 2016-12-07 MED ORDER — GABAPENTIN 300 MG PO CAPS: 300.0000 mg | ORAL_CAPSULE | Freq: Three times a day (TID) | ORAL | 0 refills | Status: DC

## 2016-12-07 NOTE — Patient Instructions (Signed)

## 2016-12-07 NOTE — Progress Notes (Signed)
Nursing Pain Medication Assessment:  Safety precautions to be maintained throughout the outpatient stay will include: orient to surroundings, keep bed in low position, maintain call bell within reach at all times, provide assistance with transfer out of bed and ambulation.  Medication Inspection Compliance: Craig Alvarado did not comply with our request to bring his pills to be counted. He was reminded that bringing the medication bottles, even when empty, is a requirement.  Medication: None brought in. Pill/Patch Count: None available to be counted. Bottle Appearance: No container available. Did not bring bottle(s) to appointment. Filled Date: N/A Last Medication intake:  Today

## 2016-12-07 NOTE — Progress Notes (Signed)
Patient's Name: Craig Alvarado  MRN: [id number]  Referring Provider: Fritzi Mandes, MD  DOB: 1945/08/30  PCP: Fritzi Mandes, MD  DOS: 12/07/2016  Note by: Vevelyn Francois NP  Service setting: Ambulatory outpatient  Specialty: Interventional Pain Management  Location: [facility] (AMB) Pain Management Facility    Patient type: Established    Primary Reason(s) for Visit: Encounter for prescription drug management. (Level of risk: moderate)  CC: Hand Pain (bilateral)  HPI  Craig Alvarado is a 70 y.o. year old, male patient, who comes today for a medication management evaluation. 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[place]); Encounter for therapeutic drug level monitoring; Routine history and physical examination of adult; Opioid dependence ([name]); Constipation due to opioid therapy; Chronic cervical radicular pain (Right); 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); Illicit drug use; Marijuana use; Neck pain; Chronic upper extremity pain (Right); Chronic shoulder pain (Right); Low testosterone; Lower urinary tract symptoms (LUTS); Headache; Vitamin D insufficiency; Risk for falls; Abnormal CT scan, cervical spine (pre-surgical) (05/22/2002); 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  His primarily concern today is the Hand Pain (bilateral)  Pain Assessment: Location: Right, Left Hand Radiating: carpel tunnel Onset: More than a month ago Duration: Chronic pain Quality: Constant, Throbbing Severity: 2 /10 (self-reported pain score)  Note: Reported level is compatible with observation.                   Effect on ADL:   Timing: Constant Modifying factors: medication  Craig Alvarado was last scheduled for an appointment on 11/09/2016 for medication management. During today's appointment we reviewed Craig Alvarado chronic pain status, as well as his outpatient medication regimen. He states that he the Lyrica is too expensive and he needs something else. He hesitate but states that he had a dose today. He declines any interventional therapy. He did not go to PT or use the Lidoderm as suggested at last visit.   The patient  reports that he does not use drugs. His body mass index is 28.44 kg/m.  Further details on both, my assessment(s), as well as the proposed treatment plan, please see below.  Controlled Substance Pharmacotherapy Assessment REMS (Risk Evaluation and Mitigation Strategy)  Analgesic:Oxycodone 58m threee times a day (318mday) MME/day:[id number] [place]  12/07/2016  3:09 PM  Sign at close encounter Nursing Pain Medication Assessment:  Safety precautions to be maintained throughout the outpatient stay will include: orient to surroundings, keep bed in low position, maintain call bell within reach at all times, provide assistance with transfer out of bed and ambulation.  Medication Inspection Compliance: Craig Alvarado not comply with our request to bring his pills to be counted. He was reminded that bringing the medication bottles, even when empty, is a requirement.  Medication: None brought in. Pill/Patch Count: None available to be counted. Bottle Appearance: No container available. Did not bring bottle(s) to appointment. Filled  Date:  N/A Last Medication intake:  Today   Pharmacokinetics: Liberation and absorption (onset of action): WNL Distribution (time to peak effect): WNL Metabolism and excretion (duration of action): WNL         Pharmacodynamics: Desired effects: Analgesia: Craig Alvarado reports >50% benefit. Functional ability: Patient reports that medication allows him to accomplish basic ADLs Clinically meaningful improvement in function (CMIF): Sustained CMIF goals met Perceived effectiveness: Described as relatively effective, allowing for increase in activities of daily living (ADL) Undesirable effects: Side-effects or Adverse reactions: None reported Monitoring: [place] PMP: Online review of the past 15-monthperiod conducted. Compliant with practice rules and regulations List of all UDS test(s) done:  Lab Results  Component Value Date   TOXASSSELUR FINAL 05/18/2016   TBuena VistaFINAL 03/21/2016   TIgnacioFINAL 11/18/2015   TFidelityFINAL 09/16/2015   TOXASSSELUR FINAL 05/24/2015   TOXASSSELUR FINAL 03/29/2015   SUMMARY FINAL 11/09/2016   Last UDS on record: ToxAssure Select 13  Date Value Ref Range Status  05/18/2016 FINAL  Final    Comment:    ==================================================================== TOXASSURE SELECT 13 (MW) ==================================================================== Test                             Result       Flag       Units Drug Present and Declared for Prescription Verification   Oxycodone                      904          EXPECTED   ng/mg creat   Oxymorphone                    4128         EXPECTED   ng/mg creat   Noroxycodone                   2544         EXPECTED   ng/mg creat   Noroxymorphone                 1142         EXPECTED   ng/mg creat    Sources of oxycodone are scheduled prescription medications.    Oxymorphone, noroxycodone, and noroxymorphone are expected    metabolites of oxycodone. Oxymorphone is also available as a     scheduled prescription medication. ==================================================================== Test                      Result    Flag   Units      Ref Range   Creatinine              72               mg/dL      >=20 ==================================================================== Declared Medications:  The flagging and interpretation on this report are based on the  following declared medications.  Unexpected results may arise from  inaccuracies in the declared medications.  **Note: The testing scope of this panel includes these medications:  Oxycodone  **Note: The testing scope of this panel does not include following  reported medications:  Aspirin (Aspirin 81)  Atorvastatin (Lipitor)  Docusate (Colace)  Finasteride (Proscar)  Lubiprostone (Amitiza)  Magnesium (Mag-Ox)  Naloxegol (Movantik)  Pregabalin (Lyrica)  Promethazine  Sumatriptan  Supplement  Tamsulosin (Flomax)  Topiramate (Topamax)  Vitamin D3 ==================================================================== For  clinical consultation, please call [phone]. ====================================================================    Summary  Date Value Ref Range Status  11/09/2016 FINAL  Final    Comment:    ==================================================================== TOXASSURE SELECT 13 (MW) ==================================================================== Test                             Result       Flag       Units Drug Present and Declared for Prescription Verification   Oxycodone                      796          EXPECTED   ng/mg creat   Oxymorphone                    3084         EXPECTED   ng/mg creat   Noroxycodone                   2815         EXPECTED   ng/mg creat   Noroxymorphone                 1034         EXPECTED   ng/mg creat    Sources of oxycodone are scheduled prescription medications.    Oxymorphone, noroxycodone, and noroxymorphone are expected     metabolites of oxycodone. Oxymorphone is also available as a    scheduled prescription medication. Drug Present not Declared for Prescription Verification   Alprazolam                     86           UNEXPECTED ng/mg creat   Alpha-hydroxyalprazolam        309          UNEXPECTED ng/mg creat    Source of alprazolam is a scheduled prescription medication.    Alpha-hydroxyalprazolam is an expected metabolite of alprazolam. ==================================================================== Test                      Result    Flag   Units      Ref Range   Creatinine              99               mg/dL      >=20 ==================================================================== Declared Medications:  The flagging and interpretation on this report are based on the  following declared medications.  Unexpected results may arise from  inaccuracies in the declared medications.  **Note: The testing scope of this panel includes these medications:  Oxycodone  **Note: The testing scope of this panel does not include following  reported medications:  Aspirin  Atorvastatin (Lipitor)  Docusate (Colace)  Finasteride (Proscar)  Magnesium (Mag-Ox)  Naloxegol (Movantik)  Naloxone (Narcan)  Pregabalin (Lyrica)  Promethazine (Phenergan)  Sumatriptan (Imitrex)  Supplement  Tamsulosin (Flomax)  Topiramate (Topamax)  Vitamin D3 ==================================================================== For clinical consultation, please call [phone]. ====================================================================    UDS interpretation: Non-Compliant          Medication Assessment Form: Reviewed. Abnormalities discussed Treatment compliance: Non-compliant Risk Assessment Profile: Aberrant behavior: failure to bring medications for pill counts Comorbid factors increasing risk of overdose: caucasian, history of substance abuse and male gender Risk of substance use disorder (SUD): Very High  Opioid Risk Tool - 11/09/16 1518      Family History of Substance Abuse   Alcohol Positive Male   Illegal Drugs Negative   Rx Drugs Negative     Personal History of Substance Abuse   Alcohol Negative   Illegal Drugs Positive Male or Male   Rx Drugs Negative     Age   Age between 47-45 years  No     History of Preadolescent Sexual Abuse   History of Preadolescent Sexual Abuse Negative or Male     Psychological Disease   Psychological Disease Negative   Depression Negative     Total Score   Opioid Risk Tool Scoring 7   Opioid Risk Interpretation Moderate Risk     ORT Scoring interpretation table:  Score <3 = Low Risk for SUD  Score between 4-7 = Moderate Risk for SUD  Score >8 = High Risk for Opioid Abuse   Risk Mitigation Strategies:  Patient Counseling: Covered Patient-Prescriber Agreement (PPA): Present and active  Notification to other healthcare providers: Done  Pharmacologic Plan: No change in therapy, at this time  Laboratory Chemistry  Inflammation Markers (CRP: Acute Phase) (ESR: Chronic Phase) Lab Results  Component Value Date   CRP <0.8 03/21/2016   ESRSEDRATE 13 03/21/2016                 Renal Function Markers Lab Results  Component Value Date   BUN 23 (H) 03/21/2016   CREATININE 1.09 03/21/2016   GFRAA >60 03/21/2016   GFRNONAA >60 03/21/2016                 Hepatic Function Markers Lab Results  Component Value Date   AST 21 03/21/2016   ALT 16 (L) 03/21/2016   ALBUMIN 4.0 03/21/2016   ALKPHOS 56 03/21/2016                 Electrolytes Lab Results  Component Value Date   NA 139 03/21/2016   K 4.1 03/21/2016   CL 108 03/21/2016   CALCIUM 8.7 (L) 03/21/2016   MG 2.2 03/21/2016                 Neuropathy Markers Lab Results  Component Value Date   VITAMINB12 658 03/21/2016                 Bone Pathology Markers Lab Results  Component Value Date   ALKPHOS 56 03/21/2016   25OHVITD1 41 03/21/2016   25OHVITD2 2.1  03/21/2016   25OHVITD3 39 03/21/2016   CALCIUM 8.7 (L) 03/21/2016                 Coagulation Parameters No results found for: INR, LABPROT, APTT, PLT               Cardiovascular Markers No results found for: BNP, HGB, HCT               Note: Lab results reviewed.  Recent Diagnostic Imaging Review  Dg Cervical Spine Complete  Result Date: 03/21/2016 CLINICAL DATA:  Chronic neck and shoulder pain since cervical fusion in 2001 EXAM: [place] 4+ VIEW COMPARISON:  None. FINDINGS: Plate and screw fixation device from anterior fusion noted at C4-5. Fusion across the C5-6 and C6-7 disc spaces as well. Normal alignment. Degenerative disc disease at C3-4 with disc space narrowing and spurring. No fracture. Prevertebral soft tissues are normal. IMPRESSION: Fusion changes from C4-C7 with anterior plate at D4-0. No acute bony abnormality.  Electronically Signed   By: Rolm Baptise M.D.   On: 03/21/2016 14:37   Dg Shoulder Right  Result Date: 03/21/2016 CLINICAL DATA:  Chronic right shoulder pain, no known injury, initial encounter EXAM: RIGHT SHOULDER - 2+ VIEW COMPARISON:  None. FINDINGS: There is no evidence of fracture or dislocation. There is no evidence of arthropathy or other focal bone abnormality. Soft tissues are unremarkable. IMPRESSION: No acute abnormality noted. Electronically Signed   By: Inez Catalina M.D.   On: 03/21/2016 14:38   Note: Imaging results reviewed.          Meds   No outpatient prescriptions have been marked as taking for the 12/07/16 encounter (Clinical Support) with Vevelyn Francois, NP.    ROS  Constitutional: Denies any fever or chills Gastrointestinal: No reported hemesis, hematochezia, vomiting, or acute GI distress Musculoskeletal: Denies any acute onset joint swelling, redness, loss of ROM, or weakness Neurological: No reported episodes of acute onset apraxia, aphasia, dysarthria, agnosia, amnesia, paralysis, loss of coordination, or loss of  consciousness  Allergies  Craig Alvarado has No Known Allergies.  PFSH  Drug: Craig Alvarado  reports that he does not use drugs. Alcohol:  reports that he drinks alcohol. Tobacco:  reports that he quit smoking about 9 years ago. He has never used smokeless tobacco. 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 Surgical: Craig Alvarado  has a past surgical history that includes Spine surgery. Family: family history includes Alcohol abuse in his father; Alzheimer's disease in his mother; Cancer in his father.  Constitutional Exam  General appearance: Well nourished, well developed, and well hydrated. In no apparent acute distress Vitals:   12/07/16 1510  BP: 132/88  Pulse: (!) 50  Resp: 20  Temp: 98.3 F (36.8 C)  TempSrc: Oral  Weight: 198 lb 3.2 oz (89.9 kg)  Height: 5' 10"  (1.778 m)   BMI Assessment: Estimated body mass index is 28.44 kg/m as calculated from the following:   Height as of this encounter: 5' 10"  (1.778 m).   Weight as of this encounter: 198 lb 3.2 oz (89.9 kg).  BMI interpretation table: BMI level Category Range association with higher incidence of chronic pain  <18 kg/m2 Underweight   18.5-24.9 kg/m2 Ideal body weight   25-29.9 kg/m2 Overweight Increased incidence by 20%  30-34.9 kg/m2 Obese (Class I) Increased incidence by 68%  35-39.9 kg/m2 Severe obesity (Class II) Increased incidence by 136%  >40 kg/m2 Extreme obesity (Class III) Increased incidence by 254%   BMI Readings from Last 4  Encounters:  12/07/16 28.44 kg/m  11/09/16 27.98 kg/m  08/09/16 27.41 kg/m  05/18/16 27.84 kg/m   Wt Readings from Last 4 Encounters:  12/07/16 198 lb 3.2 oz (89.9 kg)  11/09/16 195 lb (88.5 kg)  08/09/16 191 lb (86.6 kg)  05/18/16 194 lb (88 kg)  Psych/Mental status: Alert, oriented x 3 (person, place, & time)       Eyes: PERLA Respiratory: No evidence of acute respiratory distress  Cervical Spine Area Exam  Skin & Axial Inspection: No masses, redness, edema, swelling, or associated skin lesions Alignment: Symmetrical Functional ROM: Unrestricted ROM      Stability: No instability detected Muscle  Tone/Strength: Functionally intact. No obvious neuro-muscular anomalies detected. Sensory (Neurological): Unimpaired Palpation: No palpable anomalies              Upper Extremity (UE) Exam    Side: Right upper extremity  Side: Left upper extremity  Skin & Extremity Inspection: Skin color, temperature, and hair growth are WNL. No peripheral edema or cyanosis. No masses, redness, swelling, asymmetry, or associated skin lesions. No contractures.  Skin & Extremity Inspection: Skin color, temperature, and hair growth are WNL. No peripheral edema or cyanosis. No masses, redness, swelling, asymmetry, or associated skin lesions. No contractures.  Functional ROM: Unrestricted ROM          Functional ROM: Unrestricted ROM          Muscle Tone/Strength: Functionally intact. No obvious neuro-muscular anomalies detected.  Muscle Tone/Strength: Functionally intact. No obvious neuro-muscular anomalies detected.  Sensory (Neurological): Unimpaired  Sensory (Neurological): Unimpaired  Palpation: No palpable anomalies              Palpation: No palpable anomalies              Specialized Test(s): Deferred         Specialized Test(s): Deferred          Assessment  Primary Diagnosis & Pertinent Problem List: The primary encounter diagnosis was Bilateral carpal tunnel syndrome. Diagnoses of Neurogenic  pain, Chronic cervical radicular pain (Right), Chronic pain syndrome, Chronic pain of right upper extremity, Long term current use of opiate analgesic, and Opioid-induced constipation (OIC) were also pertinent to this visit.  Status Diagnosis  Controlled Controlled Controlled 1. Bilateral carpal tunnel syndrome   2. Neurogenic pain   3. Chronic cervical radicular pain (Right)   4. Chronic pain syndrome   5. Chronic pain of right upper extremity   6. Long term current use of opiate analgesic   7. Opioid-induced constipation (OIC)     Problems updated and reviewed during this visit: No problems updated. Plan of Care  Pharmacotherapy (Medications Ordered): Meds ordered this encounter  Medications  . gabapentin (NEURONTIN) 300 MG capsule    Sig: Take 1 capsule (300 mg total) by mouth every 8 (eight) hours.    Dispense:  90 capsule    Refill:  0    Do not place this medication, or any other prescription from our practice, on "Automatic Refill". Patient may have prescription filled one day early if pharmacy is closed on scheduled refill date.    Order Specific Question:   Supervising Provider    Answer:   Milinda Pointer [phone]  . naloxegol oxalate (MOVANTIK) 25 MG TABS tablet    Sig: Take 1 tablet (25 mg total) by mouth daily. Take on an empty stomach at least 1 hour before or 2 hours after a meal.    Dispense:  90 tablet    Refill:  0    Please instruct the patient not to break the tablet.    Order Specific Question:   Supervising Provider    Answer:   Milinda Pointer [phone]   New Prescriptions   GABAPENTIN (NEURONTIN) 300 MG CAPSULE    Take 1 capsule (300 mg total) by mouth every 8 (eight) hours.   Medications administered today: Craig Alvarado had no medications administered during this visit. Lab-work, procedure(s), and/or referral(s): No orders of the defined types were placed in this encounter.  Imaging and/or referral(s): None  Interventional therapies: Planned,  scheduled, and/or pending:   Oxycodone prescription is on hold until patient brings his  medication in for me to verify    Considering:   Diagnostic right-sided Cervical epidural steroid injection under fluoroscopic guidance, with or without sedation.    Palliative PRN treatment(s):   Not at tjis time   Provider-requested follow-up: Return in about 2 weeks (around 12/21/2016) for Follow up medication adjustment.  No future appointments. Primary Care Physician: Fritzi Mandes, MD Location: [facility] Note by: Vevelyn Francois NP Date: 12/07/2016; Time: 3:45 PM  Pain Score Disclaimer: We use the NRS-11 scale. This is a self-reported, subjective measurement of pain severity with only modest accuracy. It is used primarily to identify changes within a particular patient. It must be understood that outpatient pain scales are significantly less accurate that those used for research, where they can be applied under ideal controlled circumstances with minimal exposure to variables. In reality, the score is likely to be a combination of pain intensity and pain affect, where pain affect describes the degree of emotional arousal or changes in action readiness caused by the sensory experience of pain. Factors such as social and work situation, setting, emotional state, anxiety levels, expectation, and prior pain experience may influence pain perception and show large inter-individual differences that may also be affected by time variables.  Patient instructions provided during this appointment: Patient Instructions   ____________________________________________________________________________________________  Medication Rules  Applies to: All patients receiving prescriptions (written or electronic).  Pharmacy of record: Pharmacy where electronic prescriptions will be sent. If written prescriptions are taken to a different pharmacy, please inform the nursing staff. The  pharmacy listed in the electronic medical record should be the one where you would like electronic prescriptions to be sent.  Prescription refills: Only during scheduled appointments. Applies to both, written and electronic prescriptions.  NOTE: The following applies primarily to controlled substances (Opioid* Pain Medications).   Patient's responsibilities: 1. Pain Pills: Bring all pain pills to every appointment (except for procedure appointments). 2. Pill Bottles: Bring pills in original pharmacy bottle. Always bring newest bottle. Bring bottle, even if empty. 3. Medication refills: You are responsible for knowing and keeping track of what medications you need refilled. The day before your appointment, write a list of all prescriptions that need to be refilled. Bring that list to your appointment and give it to the admitting nurse. Prescriptions will be written only during appointments. If you forget a medication, it will not be "Called in", "Faxed", or "electronically sent". You will need to get another appointment to get these prescribed. 4. Prescription Accuracy: You are responsible for carefully inspecting your prescriptions before leaving our office. Have the discharge nurse carefully go over each prescription with you, before taking them home. Make sure that your name is accurately spelled, that your address is correct. Check the name and dose of your medication to make sure it is accurate. Check the number of pills, and the written instructions to make sure they are clear and accurate. Make sure that you are given enough medication to last until your next medication refill appointment. 5. Taking Medication: Take medication as prescribed. Never take more pills than instructed. Never take medication more frequently than prescribed. Taking less pills or less frequently is permitted and encouraged, when it comes to controlled substances (written prescriptions).  6. Inform other Doctors: Always  inform, all of your healthcare providers, of all the medications you take. 7. Pain Medication from other Providers: You are not allowed to accept any additional pain medication from any other Doctor or Healthcare provider. There  are two exceptions to this rule. (see below) In the event that you require additional pain medication, you are responsible for notifying us, as stated below. 8. Medication Agreement: You are responsible for carefully reading and following our Medication Agreement. This must be signed before receiving any prescriptions from our practice. Safely store a copy of your signed Agreement. Violations to the Agreement will result in no further prescriptions. (Additional copies of our Medication Agreement are available upon request.) 9. Laws, Rules, & Regulations: All patients are expected to follow all Federal and [employer], Rules, [employer]. Ignorance of the Laws does not constitute a valid excuse. The use of any illegal substances is prohibited. 10. Adopted CDC guidelines & recommendations: Target dosing levels will be at or below 60 MME/day. Use of benzodiazepines** is not recommended.  Exceptions: There are only two exceptions to the rule of not receiving pain medications from other Healthcare Providers. 1. Exception #1 (Emergencies): In the event of an emergency (i.e.: accident requiring emergency care), you are allowed to receive additional pain medication. However, you are responsible for: As soon as you are able, call our office [phone], at any time of the day or night, and leave a message stating your name, the date and nature of the emergency, and the name and dose of the medication prescribed. In the event that your call is answered by a member of our staff, make sure to document and save the date, time, and the name of the person that took your information.  2. Exception #2 (Planned Surgery): In the event that you are scheduled by another doctor or dentist to  have any type of surgery or procedure, you are allowed (for a period no longer than 30 days), to receive additional pain medication, for the acute post-op pain. However, in this case, you are responsible for picking up a copy of our "Post-op Pain Management for Surgeons" handout, and giving it to your surgeon or dentist. This document is available at our office, and does not require an appointment to obtain it. Simply go to our office during business hours (Monday-Thursday from 8:00 AM to 4:00 PM) (Friday 8:00 AM to 12:00 Noon) or if you have a scheduled appointment with [place], prior to your surgery, and ask for it by name. In addition, you will need to provide [place] with your name, name of your surgeon, type of surgery, and date of procedure or surgery.  *Opioid medications include: morphine, codeine, oxycodone, oxymorphone, hydrocodone, hydromorphone, meperidine, tramadol, tapentadol, buprenorphine, fentanyl, methadone. **Benzodiazepine medications include: diazepam (Valium), alprazolam (Xanax), clonazepam (Klonopine), lorazepam (Ativan), clorazepate (Tranxene), chlordiazepoxide (Librium), estazolam (Prosom), oxazepam (Serax), temazepam (Restoril), triazolam (Halcion)  ____________________________________________________________________________________________

## 2016-12-11 ENCOUNTER — Ambulatory Visit: Payer: Medicare HMO | Attending: Nurse Practitioner | Admitting: Nurse Practitioner

## 2016-12-11 ENCOUNTER — Other Ambulatory Visit: Payer: Self-pay | Admitting: Nurse Practitioner

## 2016-12-11 DIAGNOSIS — G894 Chronic pain syndrome: Secondary | ICD-10-CM

## 2016-12-11 DIAGNOSIS — Z048 Encounter for examination and observation for other specified reasons: Secondary | ICD-10-CM | POA: Diagnosis not present

## 2016-12-11 MED ORDER — OXYCODONE HCL 10 MG PO TABS: 10.0000 mg | ORAL_TABLET | Freq: Three times a day (TID) | ORAL | 0 refills | Status: DC | PRN

## 2016-12-11 NOTE — Progress Notes (Signed)
Nursing Pain Medication Assessment:  Safety precautions to be maintained throughout the outpatient stay will include: orient to surroundings, keep bed in low position, maintain call bell within reach at all times, provide assistance with transfer out of bed and ambulation.  Medication Inspection Compliance: Craig Alvarado did not comply with our request to bring his pills to be counted. He was reminded that bringing the medication bottles, even when empty, is a requirement.  Medication: Oxycodone IR Pill/Patch Count: 42  of 90 pills remain Pill/Patch Appearance: Markings consistent with prescribed medication Bottle Appearance: Standard pharmacy container. Clearly labeled. Filled Date: 08/02/ 2018 Last Medication intake:  Today   Came in today for pill count, new script given.

## 2016-12-27 ENCOUNTER — Ambulatory Visit: Payer: Medicare HMO | Admitting: Nurse Practitioner

## 2017-01-03 ENCOUNTER — Telehealth: Payer: Self-pay | Admitting: Pain Medicine

## 2017-01-03 NOTE — Telephone Encounter (Signed)
Patient called asking for meds to be called in as he is almost out. He had appt on 12-27-16, he rescheduled to 01-16-17 Please call patient

## 2017-01-03 NOTE — Telephone Encounter (Signed)
Patient notified that per policy , no prescriptions will be called in.

## 2017-01-04 ENCOUNTER — Other Ambulatory Visit: Payer: Self-pay | Admitting: Nurse Practitioner

## 2017-01-04 ENCOUNTER — Telehealth: Payer: Self-pay | Admitting: *Deleted

## 2017-01-04 MED ORDER — GABAPENTIN 300 MG PO CAPS: 300.0000 mg | ORAL_CAPSULE | Freq: Three times a day (TID) | ORAL | 0 refills | Status: DC

## 2017-01-04 NOTE — Telephone Encounter (Signed)
Patient informed that Gabapentin has been called in.

## 2017-01-04 NOTE — Telephone Encounter (Signed)
Yesterday I informed patient we do not call in meds outside of appointment. He has called again today. Please advise.

## 2017-01-16 ENCOUNTER — Other Ambulatory Visit: Payer: Self-pay | Admitting: Nurse Practitioner

## 2017-01-16 ENCOUNTER — Encounter: Payer: Self-pay | Admitting: Nurse Practitioner

## 2017-01-16 ENCOUNTER — Ambulatory Visit: Payer: Medicare HMO | Attending: Nurse Practitioner | Admitting: Nurse Practitioner

## 2017-01-16 VITALS — BP 119/70 | HR 44 | Temp 98.1°F | Resp 16 | Ht 70.0 in | Wt 190.0 lb

## 2017-01-16 DIAGNOSIS — Z87442 Personal history of urinary calculi: Secondary | ICD-10-CM | POA: Diagnosis not present

## 2017-01-16 DIAGNOSIS — M4802 Spinal stenosis, cervical region: Secondary | ICD-10-CM | POA: Diagnosis not present

## 2017-01-16 DIAGNOSIS — D369 Benign neoplasm, unspecified site: Secondary | ICD-10-CM | POA: Insufficient documentation

## 2017-01-16 DIAGNOSIS — M25511 Pain in right shoulder: Secondary | ICD-10-CM | POA: Diagnosis not present

## 2017-01-16 DIAGNOSIS — Z5181 Encounter for therapeutic drug level monitoring: Secondary | ICD-10-CM | POA: Diagnosis not present

## 2017-01-16 DIAGNOSIS — G894 Chronic pain syndrome: Secondary | ICD-10-CM | POA: Insufficient documentation

## 2017-01-16 DIAGNOSIS — G5603 Carpal tunnel syndrome, bilateral upper limbs: Secondary | ICD-10-CM | POA: Insufficient documentation

## 2017-01-16 DIAGNOSIS — Z79891 Long term (current) use of opiate analgesic: Secondary | ICD-10-CM

## 2017-01-16 DIAGNOSIS — Z7982 Long term (current) use of aspirin: Secondary | ICD-10-CM | POA: Insufficient documentation

## 2017-01-16 DIAGNOSIS — M62838 Other muscle spasm: Secondary | ICD-10-CM | POA: Insufficient documentation

## 2017-01-16 DIAGNOSIS — R5383 Other fatigue: Secondary | ICD-10-CM | POA: Insufficient documentation

## 2017-01-16 DIAGNOSIS — M5412 Radiculopathy, cervical region: Secondary | ICD-10-CM | POA: Insufficient documentation

## 2017-01-16 DIAGNOSIS — M79641 Pain in right hand: Secondary | ICD-10-CM | POA: Insufficient documentation

## 2017-01-16 DIAGNOSIS — E559 Vitamin D deficiency, unspecified: Secondary | ICD-10-CM | POA: Insufficient documentation

## 2017-01-16 DIAGNOSIS — G540 Brachial plexus disorders: Secondary | ICD-10-CM | POA: Insufficient documentation

## 2017-01-16 DIAGNOSIS — K5900 Constipation, unspecified: Secondary | ICD-10-CM | POA: Insufficient documentation

## 2017-01-16 DIAGNOSIS — M79601 Pain in right arm: Secondary | ICD-10-CM | POA: Diagnosis not present

## 2017-01-16 DIAGNOSIS — M7712 Lateral epicondylitis, left elbow: Secondary | ICD-10-CM | POA: Insufficient documentation

## 2017-01-16 DIAGNOSIS — F1290 Cannabis use, unspecified, uncomplicated: Secondary | ICD-10-CM | POA: Insufficient documentation

## 2017-01-16 DIAGNOSIS — M79642 Pain in left hand: Secondary | ICD-10-CM | POA: Insufficient documentation

## 2017-01-16 DIAGNOSIS — N139 Obstructive and reflux uropathy, unspecified: Secondary | ICD-10-CM | POA: Insufficient documentation

## 2017-01-16 DIAGNOSIS — Z86010 Personal history of colonic polyps: Secondary | ICD-10-CM | POA: Diagnosis not present

## 2017-01-16 DIAGNOSIS — G8929 Other chronic pain: Secondary | ICD-10-CM

## 2017-01-16 DIAGNOSIS — R194 Change in bowel habit: Secondary | ICD-10-CM | POA: Diagnosis not present

## 2017-01-16 DIAGNOSIS — G43909 Migraine, unspecified, not intractable, without status migrainosus: Secondary | ICD-10-CM | POA: Diagnosis not present

## 2017-01-16 DIAGNOSIS — E7801 Familial hypercholesterolemia: Secondary | ICD-10-CM | POA: Insufficient documentation

## 2017-01-16 DIAGNOSIS — G479 Sleep disorder, unspecified: Secondary | ICD-10-CM | POA: Insufficient documentation

## 2017-01-16 DIAGNOSIS — M79671 Pain in right foot: Secondary | ICD-10-CM | POA: Insufficient documentation

## 2017-01-16 DIAGNOSIS — E782 Mixed hyperlipidemia: Secondary | ICD-10-CM | POA: Insufficient documentation

## 2017-01-16 DIAGNOSIS — Z1211 Encounter for screening for malignant neoplasm of colon: Secondary | ICD-10-CM | POA: Insufficient documentation

## 2017-01-16 DIAGNOSIS — I10 Essential (primary) hypertension: Secondary | ICD-10-CM | POA: Insufficient documentation

## 2017-01-16 MED ORDER — GABAPENTIN 600 MG PO TABS: 600.0000 mg | ORAL_TABLET | Freq: Three times a day (TID) | ORAL | 0 refills | Status: AC

## 2017-01-16 MED ORDER — OXYCODONE HCL 10 MG PO TABS: 10.0000 mg | ORAL_TABLET | Freq: Three times a day (TID) | ORAL | 0 refills | Status: AC | PRN

## 2017-01-16 NOTE — Progress Notes (Signed)
Patient's Name: Craig Alvarado  MRN: [id number]  Referring Provider: Fritzi Mandes, MD  DOB: Sep 22, 1945  PCP: Fritzi Mandes, MD  DOS: 01/16/2017  Note by: Vevelyn Francois NP  Service setting: Ambulatory outpatient  Specialty: Interventional Pain Management  Location: [facility] (AMB) Pain Management Facility    Patient type: Established    Primary Reason(s) for Visit: Encounter for prescription drug management. (Level of risk: moderate)  CC: Hand Pain (bilateral)  HPI  Craig Alvarado is a 71 y.o. year old, male patient, who comes today for a medication management evaluation. 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[place]); Encounter for therapeutic drug level monitoring; Routine history and physical examination of adult; Opioid dependence ([place]); Constipation due to opioid therapy; Chronic cervical radicular pain (Right); 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); Illicit drug use; Marijuana use; Neck pain; Chronic upper extremity pain (Right); Chronic shoulder pain (Right); Low testosterone; Lower urinary tract symptoms (LUTS); Headache; Vitamin D insufficiency; Risk for falls; Abnormal CT scan, cervical spine (pre-surgical) (05/22/2002); 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  His primarily concern today is the Hand Pain (bilateral)  Pain Assessment: Location: Right, Left Hand Radiating: n/a Onset: More than a month ago Duration: Chronic pain Quality: Constant, Throbbing Severity: 7 /10 (self-reported pain score)  Note: Reported level is compatible with observation.                    Effect on ADL:   Timing: Constant Modifying factors: medication  Craig Alvarado was last scheduled for an appointment on 12/11/2016 for medication management. During today's appointment we reviewed Craig Alvarado chronic pain status, as well as his outpatient medication regimen. He continues to have increased hand pain. He admits that the Gabapentin is not effective at the current dose.   The patient  reports that he does not use drugs. His body mass index is 27.26 kg/m.  Further details on both, my assessment(s), as well as the proposed treatment plan, please see below.  Controlled Substance Pharmacotherapy Assessment REMS (Risk Evaluation and Mitigation Strategy)  Analgesic:Oxycodone 19m 3 times a day (329mday) MME/day:[id number] WheLandis MartinsN  01/16/2017  3:36 PM  Sign at close encounter Nursing Pain Medication Assessment:  Safety precautions to be maintained throughout the outpatient stay will include: orient to surroundings, keep bed in low position, maintain call bell within reach at all times, provide assistance with transfer out of bed and ambulation.  Medication Inspection Compliance: Pill count conducted under aseptic conditions, in front of the patient. Neither the pills nor the bottle was removed from the patient's sight at any time. Once count was completed pills were immediately returned to the patient in their original bottle.  Medication: Oxycodone IR   Pill/Patch Count: 25 of 90 pills remain Pill/Patch Appearance: Markings consistent with prescribed medication Bottle Appearance: Standard pharmacy container. Clearly labeled. Filled Date: 09/3 /  2018 Last Medication intake:  Today   Pharmacokinetics: Liberation and absorption (onset of action): WNL Distribution (time to peak effect): WNL Metabolism and excretion (duration of action): WNL         Pharmacodynamics: Desired effects: Analgesia: Craig Alvarado reports >50% benefit. Functional ability: Patient reports that medication allows him to accomplish basic ADLs Clinically meaningful improvement in function (CMIF): Sustained CMIF goals met Perceived effectiveness: Described as relatively effective, allowing for increase in activities of daily living (ADL) Undesirable effects: Side-effects or Adverse reactions: None reported Monitoring: [place] PMP: Online review of the past 32-monthperiod conducted. Compliant with practice rules and regulations Last UDS on record: Summary  Date Value Ref Range Status  11/09/2016 FINAL  Final    Comment:    ==================================================================== TOXASSURE SELECT 13 (MW) ==================================================================== Test                             Result       Flag       Units Drug Present and Declared for Prescription Verification   Oxycodone                      796          EXPECTED   ng/mg creat   Oxymorphone                    3084         EXPECTED   ng/mg creat   Noroxycodone                   2815         EXPECTED   ng/mg creat   Noroxymorphone                 1034         EXPECTED   ng/mg creat    Sources of oxycodone are scheduled prescription medications.    Oxymorphone, noroxycodone, and noroxymorphone are expected    metabolites of oxycodone. Oxymorphone is also available as a    scheduled prescription medication. Drug Present not Declared for Prescription Verification   Alprazolam                     86           UNEXPECTED ng/mg creat   Alpha-hydroxyalprazolam        309          UNEXPECTED ng/mg creat    Source of alprazolam is a scheduled prescription medication.     Alpha-hydroxyalprazolam is an expected metabolite of alprazolam. ==================================================================== Test                      Result    Flag   Units      Ref Range   Creatinine              99               mg/dL      >=20 ==================================================================== Declared Medications:  The flagging and interpretation on this report are based on the  following declared medications.  Unexpected results may arise from  inaccuracies in the declared medications.  **Note: The testing scope of this panel includes these medications:  Oxycodone  **Note: The testing scope of this panel does not include following  reported medications:  Aspirin  Atorvastatin (Lipitor)  Docusate (Colace)  Finasteride (Proscar)  Magnesium (  Mag-Ox)  Naloxegol (Movantik)  Naloxone (Narcan)  Pregabalin (Lyrica)  Promethazine (Phenergan)  Sumatriptan (Imitrex)  Supplement  Tamsulosin (Flomax)  Topiramate (Topamax)  Vitamin D3 ==================================================================== For clinical consultation, please call [phone]. ====================================================================    UDS interpretation: Compliant          Medication Assessment Form: Reviewed. Patient indicates being compliant with therapy Treatment compliance: Compliant Risk Assessment Profile: Aberrant behavior: See prior evaluations. None observed or detected today Comorbid factors increasing risk of overdose: See prior notes. No additional risks detected today Risk of substance use disorder (SUD): Low     Opioid Risk Tool - 01/16/17 1413      Family History of Substance Abuse   Alcohol Positive Male   Illegal Drugs Negative   Rx Drugs Negative     Personal History of Substance Abuse   Alcohol Negative   Illegal Drugs Positive Male or Male   Rx Drugs Negative     Age   Age between 54-45 years  No     History of Preadolescent  Sexual Abuse   History of Preadolescent Sexual Abuse Negative or Male     Psychological Disease   Psychological Disease Negative   Depression Negative     Total Score   Opioid Risk Tool Scoring 7   Opioid Risk Interpretation Moderate Risk     ORT Scoring interpretation table:  Score <3 = Low Risk for SUD  Score between 4-7 = Moderate Risk for SUD  Score >8 = High Risk for Opioid Abuse   Risk Mitigation Strategies:  Patient Counseling: Covered Patient-Prescriber Agreement (PPA): Present and active  Notification to other healthcare providers: Done  Pharmacologic Plan: No change in therapy, at this time  Laboratory Chemistry  Inflammation Markers (CRP: Acute Phase) (ESR: Chronic Phase) Lab Results  Component Value Date   CRP <0.8 03/21/2016   ESRSEDRATE 13 03/21/2016                 Renal Function Markers Lab Results  Component Value Date   BUN 23 (H) 03/21/2016   CREATININE 1.09 03/21/2016   GFRAA >60 03/21/2016   GFRNONAA >60 03/21/2016                 Hepatic Function Markers Lab Results  Component Value Date   AST 21 03/21/2016   ALT 16 (L) 03/21/2016   ALBUMIN 4.0 03/21/2016   ALKPHOS 56 03/21/2016                 Electrolytes Lab Results  Component Value Date   NA 139 03/21/2016   K 4.1 03/21/2016   CL 108 03/21/2016   CALCIUM 8.7 (L) 03/21/2016   MG 2.2 03/21/2016                 Neuropathy Markers Lab Results  Component Value Date   VITAMINB12 658 03/21/2016                 Bone Pathology Markers Lab Results  Component Value Date   ALKPHOS 56 03/21/2016   25OHVITD1 41 03/21/2016   25OHVITD2 2.1 03/21/2016   25OHVITD3 39 03/21/2016   CALCIUM 8.7 (L) 03/21/2016                 Coagulation Parameters No results found for: INR, LABPROT, APTT, PLT               Cardiovascular Markers No results found for: BNP, HGB, HCT  Note: Lab results reviewed.  Recent Diagnostic Imaging Results  DG Shoulder Right CLINICAL DATA:   Chronic right shoulder pain, no known injury, initial encounter  EXAM: RIGHT SHOULDER - 2+ VIEW  COMPARISON:  None.  FINDINGS: There is no evidence of fracture or dislocation. There is no evidence of arthropathy or other focal bone abnormality. Soft tissues are unremarkable.  IMPRESSION: No acute abnormality noted.  Electronically Signed   By: Inez Catalina M.D.   On: 03/21/2016 14:38 DG Cervical Spine Complete CLINICAL DATA:  Chronic neck and shoulder pain since cervical fusion in 2001  EXAM: [place] 4+ VIEW  COMPARISON:  None.  FINDINGS: Plate and screw fixation device from anterior fusion noted at C4-5. Fusion across the C5-6 and C6-7 disc spaces as well. Normal alignment. Degenerative disc disease at C3-4 with disc space narrowing and spurring. No fracture. Prevertebral soft tissues are normal.  IMPRESSION: Fusion changes from C4-C7 with anterior plate at Y6-0. No acute bony abnormality.  Electronically Signed   By: Rolm Baptise M.D.   On: 03/21/2016 14:37  Note: Imaging results reviewed.        Meds   Current Outpatient Prescriptions:  .  aspirin 81 MG chewable tablet, Chew 81 mg by mouth., Disp: , Rfl:  .  atorvastatin (LIPITOR) 40 MG tablet, Take 40 mg by mouth daily at 6 PM. , Disp: , Rfl:  .  Cholecalciferol (VITAMIN D3) 2000 units capsule, Take 1 capsule (2,000 Units total) by mouth daily., Disp: 30 capsule, Rfl: PRN .  docusate sodium (STOOL SOFTENER) 100 MG capsule, Take by mouth., Disp: , Rfl:  .  finasteride (PROSCAR) 5 MG tablet, TAKE 1 TABLET BY MOUTH  DAILY, Disp: , Rfl:  .  lidocaine (XYLOCAINE) 5 % ointment, Apply 1 application topically 4 (four) times daily as needed for moderate pain., Disp: 35.44 g, Rfl: 2 .  magnesium oxide (MAG-OX) 400 MG tablet, Take 1 tablet (400 mg total) by mouth daily., Disp: 30 tablet, Rfl: 1 .  Naloxone HCl (NARCAN) 4 MG/0.1ML LIQD, Place 1 spray into the nose once. Spray half of bottle content  into each nostril, then call 911, Disp: 2 each, Rfl: 0 .  [START ON 01/23/2017] Oxycodone HCl 10 MG TABS, Take 1 tablet (10 mg total) by mouth every 8 (eight) hours as needed., Disp: 90 tablet, Rfl: 0 .  promethazine (PHENERGAN) 25 MG tablet, Take 25 mg by mouth every 6 (six) hours as needed for nausea or vomiting., Disp: , Rfl:  .  SUMAtriptan (IMITREX) 50 MG tablet, TAKE ONE TABLET BY MOUTH ONCE DAILY AS NEEDED FOR  MIGRAINE., Disp: , Rfl:  .  topiramate (TOPAMAX) 50 MG tablet, Take 50 mg by mouth daily. , Disp: , Rfl:  .  Wheat Dextrin (BENEFIBER) POWD, Stir 2 tsp. TID into 4-8 oz of any non-carbonated beverage or soft food (hot or cold), Disp: 500 g, Rfl: 1 .  gabapentin (NEURONTIN) 600 MG tablet, Take 1 tablet (600 mg total) by mouth 3 (three) times daily., Disp: 90 tablet, Rfl: 0  ROS  Constitutional: Denies any fever or chills Gastrointestinal: No reported hemesis, hematochezia, vomiting, or acute GI distress Musculoskeletal: Denies any acute onset joint swelling, redness, loss of ROM, or weakness Neurological: No reported episodes of acute onset apraxia, aphasia, dysarthria, agnosia, amnesia, paralysis, loss of coordination, or loss of consciousness  Allergies  Craig Alvarado has No Known Allergies.  PFSH  Drug: Craig Alvarado  reports that he does not use drugs. Alcohol:  reports that he drinks alcohol. Tobacco:  reports that he quit smoking about 9 years ago. He has never used smokeless tobacco. 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 Surgical: Craig Alvarado  has a past surgical history that includes Spine surgery. Family: family history includes Alcohol abuse in his father; Alzheimer's disease in his mother; Cancer in his father.  Constitutional Exam  General appearance: Well nourished, well developed, and well hydrated. In no apparent acute distress Vitals:   01/16/17 1403  BP: 119/70  Pulse: (!) 44  Resp: 16  Temp: 98.1 F (36.7 C)  TempSrc: Oral  SpO2: 100%  Weight: 190 lb (86.2 kg)  Height: 5' 10"  (1.778 m)   BMI Assessment: Estimated body mass index is 27.26 kg/m as calculated from the following:   Height as of this encounter: 5' 10"  (1.778 m).   Weight as of this encounter: 190 lb (86.2 kg). Psych/Mental status: Alert, oriented x 3 (person, place, & time)       Eyes: PERLA Respiratory: No evidence of acute respiratory distress  Cervical Spine Area Exam  Skin & Axial Inspection: No masses, redness, edema, swelling, or associated skin lesions Alignment: Symmetrical Functional ROM: Unrestricted ROM      Stability: No instability detected Muscle Tone/Strength: Functionally intact. No obvious neuro-muscular anomalies detected. Sensory (Neurological): Unimpaired Palpation: No palpable anomalies              Upper Extremity (UE) Exam    Side: Right upper extremity  Side: Left upper extremity  Skin & Extremity Inspection: Skin color, temperature, and hair growth are WNL. No peripheral edema or cyanosis. No masses, redness, swelling, asymmetry, or associated skin lesions. No contractures.  Skin & Extremity Inspection: Skin color, temperature, and hair growth are WNL. No peripheral edema or cyanosis. No masses, redness, swelling, asymmetry, or associated skin lesions. No contractures.  Functional ROM: Unrestricted ROM          Functional ROM: Unrestricted ROM          Muscle Tone/Strength: Functionally intact. No obvious neuro-muscular anomalies detected.  Muscle Tone/Strength:  Functionally intact. No obvious neuro-muscular anomalies detected.  Sensory (Neurological): Unimpaired          Sensory (Neurological): Unimpaired          Palpation: No palpable anomalies              Palpation: No palpable anomalies              Specialized Test(s): Deferred         Specialized Test(s): Deferred          Thoracic Spine Area Exam  Skin & Axial Inspection: No masses, redness, or swelling Alignment: Symmetrical Functional ROM: Unrestricted ROM Stability: No instability detected Muscle Tone/Strength: Functionally intact. No obvious neuro-muscular anomalies detected. Sensory (Neurological): Unimpaired Muscle strength & Tone: No palpable anomalies  Lumbar Spine Area Exam  Skin & Axial Inspection: No masses, redness, or swelling Alignment: Symmetrical Functional ROM: Unrestricted ROM      Stability: No instability detected Muscle Tone/Strength: Functionally intact. No obvious neuro-muscular anomalies detected. Sensory (Neurological): Unimpaired Palpation: No palpable anomalies       Provocative Tests: Lumbar Hyperextension and  rotation test: evaluation deferred today       Lumbar Lateral bending test: evaluation deferred today       Patrick's Maneuver: evaluation deferred today                    Gait & Posture Assessment  Ambulation: Unassisted Gait: Relatively normal for age and body habitus Posture: WNL   Lower Extremity Exam    Side: Right lower extremity  Side: Left lower extremity  Skin & Extremity Inspection: Skin color, temperature, and hair growth are WNL. No peripheral edema or cyanosis. No masses, redness, swelling, asymmetry, or associated skin lesions. No contractures.  Skin & Extremity Inspection: Skin color, temperature, and hair growth are WNL. No peripheral edema or cyanosis. No masses, redness, swelling, asymmetry, or associated skin lesions. No contractures.  Functional ROM: Unrestricted ROM          Functional ROM: Unrestricted ROM          Muscle  Tone/Strength: Functionally intact. No obvious neuro-muscular anomalies detected.  Muscle Tone/Strength: Functionally intact. No obvious neuro-muscular anomalies detected.  Sensory (Neurological): Unimpaired  Sensory (Neurological): Unimpaired  Palpation: No palpable anomalies  Palpation: No palpable anomalies   Assessment  Primary Diagnosis & Pertinent Problem List: The primary encounter diagnosis was Bilateral carpal tunnel syndrome. 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  Status Diagnosis  Controlled Controlled Controlled 1. Bilateral carpal tunnel syndrome   2. 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 Chronic cervical radicular pain (Right)   4. Chronic pain syndrome     Problems updated and reviewed during this visit: No problems updated. Plan of Care  Pharmacotherapy (Medications Ordered): Meds ordered this encounter  Medications  . gabapentin (NEURONTIN) 600 MG tablet    Sig: Take 1 tablet (600 mg total) by mouth 3 (three) times daily.    Dispense:  90 tablet    Refill:  0    Do not place this medication, or any other prescription from our practice, on "Automatic Refill". Patient may have prescription filled one day early if pharmacy is closed on scheduled refill date.    Order Specific Question:   Supervising Provider    Answer:   Milinda Pointer [phone]  . Oxycodone HCl 10 MG TABS    Sig: Take 1 tablet (10 mg total) by mouth every 8 (eight) hours as needed.    Dispense:  90 tablet    Refill:  0    Do not place this medication, or any other prescription from our practice, on "Automatic Refill". Patient may have prescription filled one day early if pharmacy is closed on scheduled refill date. Do not fill until:01/23/2017 To last until:02/22/2017    Order Specific Question:   Supervising Provider    Answer:   Milinda Pointer [phone]    New Prescriptions   GABAPENTIN (NEURONTIN) 600 MG TABLET    Take 1 tablet (600 mg total) by mouth 3 (three) times daily.   Medications administered today: Craig Alvarado had no medications administered during this visit. Lab-work, procedure(s), and/or referral(s): No orders of the defined types were placed in this encounter.  Imaging and/or referral(s): None  Interventional therapies: Planned, scheduled, and/or pending:  Not at this time   Considering:  Diagnostic right-sided Cervical epidural steroid injection   Palliative PRN treatment(s):  Not at this time     Provider-requested follow-up: No Follow-up on  file.  Future Appointments Date Time Provider [place]  02/19/2017 8:45 AM Vevelyn Francois, NP [facility] None   Primary Care Physician: Fritzi Mandes, MD Location: [facility] Outpatient Pain Management Facility Note by: Vevelyn Francois NP Date: 01/16/2017; Time: 3:45 PM  Pain Score Disclaimer: We use the NRS-11 scale. This is a self-reported, subjective measurement of pain severity with only modest accuracy. It is used primarily to identify changes within a particular patient. It must be understood that outpatient pain scales are significantly less accurate that those used for research, where they can be applied under ideal controlled circumstances with minimal exposure to variables. In reality, the score is likely to be a combination of pain intensity and pain affect, where pain affect describes the degree of emotional arousal or changes in action readiness caused by the sensory experience of pain. Factors such as social and work situation, setting, emotional state, anxiety levels, expectation, and prior pain experience may influence pain perception and show large inter-individual differences that may also be affected by time variables.  Patient instructions provided during this appointment: Patient Instructions    ____________________________________________________________________________________________  Medication Rules  Applies to: All patients receiving prescriptions (written or electronic).  Pharmacy of record: Pharmacy where electronic prescriptions will be sent. If written prescriptions are taken to a different pharmacy, please inform the nursing staff. The pharmacy listed in the electronic medical record should be the one where you would like electronic prescriptions to be sent.  Prescription refills: Only during scheduled appointments. Applies to both, written and electronic prescriptions.  NOTE: The following applies primarily to controlled substances (Opioid* Pain Medications).   Patient's responsibilities: 1. Pain Pills: Bring all pain pills to every appointment (except for procedure appointments). 2. Pill Bottles: Bring pills in original pharmacy bottle. Always bring newest bottle. Bring bottle, even if empty. 3. Medication refills: You are responsible for knowing and keeping track of what medications you need refilled. The day before your appointment, write a list of all prescriptions that need to be refilled. Bring that list to your appointment and give it to the admitting nurse. Prescriptions will be written only during appointments. If you forget a medication, it will not be "Called in", "Faxed", or "electronically sent". You will need to get another appointment to get these prescribed. 4. Prescription Accuracy: You are responsible for carefully inspecting your prescriptions before leaving our office. Have the discharge nurse carefully go over each prescription with you, before taking them home. Make sure that your name is accurately spelled, that your address is correct. Check the name and dose of your medication to make sure it is accurate. Check the number of pills, and the written instructions to make sure they are clear and accurate. Make sure that you are given enough medication to  last until your next medication refill appointment. 5. Taking Medication: Take medication as prescribed. Never take more pills than instructed. Never take medication more frequently than prescribed. Taking less pills or less frequently is permitted and encouraged, when it comes to controlled substances (written prescriptions).  6. Inform other Doctors: Always inform, all of your healthcare providers, of all the medications you take. 7. Pain Medication from other Providers: You are not allowed to accept any additional pain medication from any other Doctor or Healthcare provider. There are two exceptions to this rule. (see below) In the event that you require additional pain medication, you are responsible for notifying us, as stated below. 8. Medication Agreement: You are responsible for carefully reading and  following our Medication Agreement. This must be signed before receiving any prescriptions from our practice. Safely store a copy of your signed Agreement. Violations to the Agreement will result in no further prescriptions. (Additional copies of our Medication Agreement are available upon request.) 9. Laws, Rules, & Regulations: All patients are expected to follow all Federal and [employer], [employer], Rules, [employer]. Ignorance of the Laws does not constitute a valid excuse. The use of any illegal substances is prohibited. 10. Adopted CDC guidelines & recommendations: Target dosing levels will be at or below 60 MME/day. Use of benzodiazepines** is not recommended.  Exceptions: There are only two exceptions to the rule of not receiving pain medications from other Healthcare Providers. 1. Exception #1 (Emergencies): In the event of an emergency (i.e.: accident requiring emergency care), you are allowed to receive additional pain medication. However, you are responsible for: As soon as you are able, call our office [phone], at any time of the day or night, and leave a message stating your  name, the date and nature of the emergency, and the name and dose of the medication prescribed. In the event that your call is answered by a member of our staff, make sure to document and save the date, time, and the name of the person that took your information.  2. Exception #2 (Planned Surgery): In the event that you are scheduled by another doctor or dentist to have any type of surgery or procedure, you are allowed (for a period no longer than 30 days), to receive additional pain medication, for the acute post-op pain. However, in this case, you are responsible for picking up a copy of our "Post-op Pain Management for Surgeons" handout, and giving it to your surgeon or dentist. This document is available at our office, and does not require an appointment to obtain it. Simply go to our office during business hours (Monday-Thursday from 8:00 AM to 4:00 PM) (Friday 8:00 AM to 12:00 Noon) or if you have a scheduled appointment with [place], prior to your surgery, and ask for it by name. In addition, you will need to provide [place] with your name, name of your surgeon, type of surgery, and date of procedure or surgery.  *Opioid medications include: morphine, codeine, oxycodone, oxymorphone, hydrocodone, hydromorphone, meperidine, tramadol, tapentadol, buprenorphine, fentanyl, methadone. **Benzodiazepine medications include: diazepam (Valium), alprazolam (Xanax), clonazepam (Klonopine), lorazepam (Ativan), clorazepate (Tranxene), chlordiazepoxide (Librium), estazolam (Prosom), oxazepam (Serax), temazepam (Restoril), triazolam (Halcion)  ____________________________________________________________________________________________

## 2017-01-16 NOTE — Progress Notes (Signed)
Nursing Pain Medication Assessment:  Safety precautions to be maintained throughout the outpatient stay will include: orient to surroundings, keep bed in low position, maintain call bell within reach at all times, provide assistance with transfer out of bed and ambulation.  Medication Inspection Compliance: Pill count conducted under aseptic conditions, in front of the patient. Neither the pills nor the bottle was removed from the patient's sight at any time. Once count was completed pills were immediately returned to the patient in their original bottle.  Medication: Oxycodone IR   Pill/Patch Count: 25 of 90 pills remain Pill/Patch Appearance: Markings consistent with prescribed medication Bottle Appearance: Standard pharmacy container. Clearly labeled. Filled Date: 09/3 / 2018 Last Medication intake:  Today

## 2017-01-16 NOTE — Patient Instructions (Signed)

## 2017-01-16 NOTE — Addendum Note (Signed)
Addended by: Vevelyn Francois on: 01/16/2017 04:11 PM   Modules accepted: Orders

## 2017-01-20 LAB — TOXASSURE SELECT 13 (MW), URINE

## 2017-01-22 ENCOUNTER — Encounter: Payer: Self-pay | Admitting: Nurse Practitioner

## 2017-02-19 ENCOUNTER — Ambulatory Visit: Payer: Medicare HMO | Admitting: Nurse Practitioner

## 2018-01-14 IMAGING — CR DG CERVICAL SPINE COMPLETE 4+V
5 series · 5 of 5 positions shown · non-contrast
Comparison: None.

CLINICAL DATA: Chronic neck and shoulder pain since cervical fusion
in 4663

EXAM:
CERVICAL SPINE - COMPLETE 4+ VIEW

[c-spine lat]
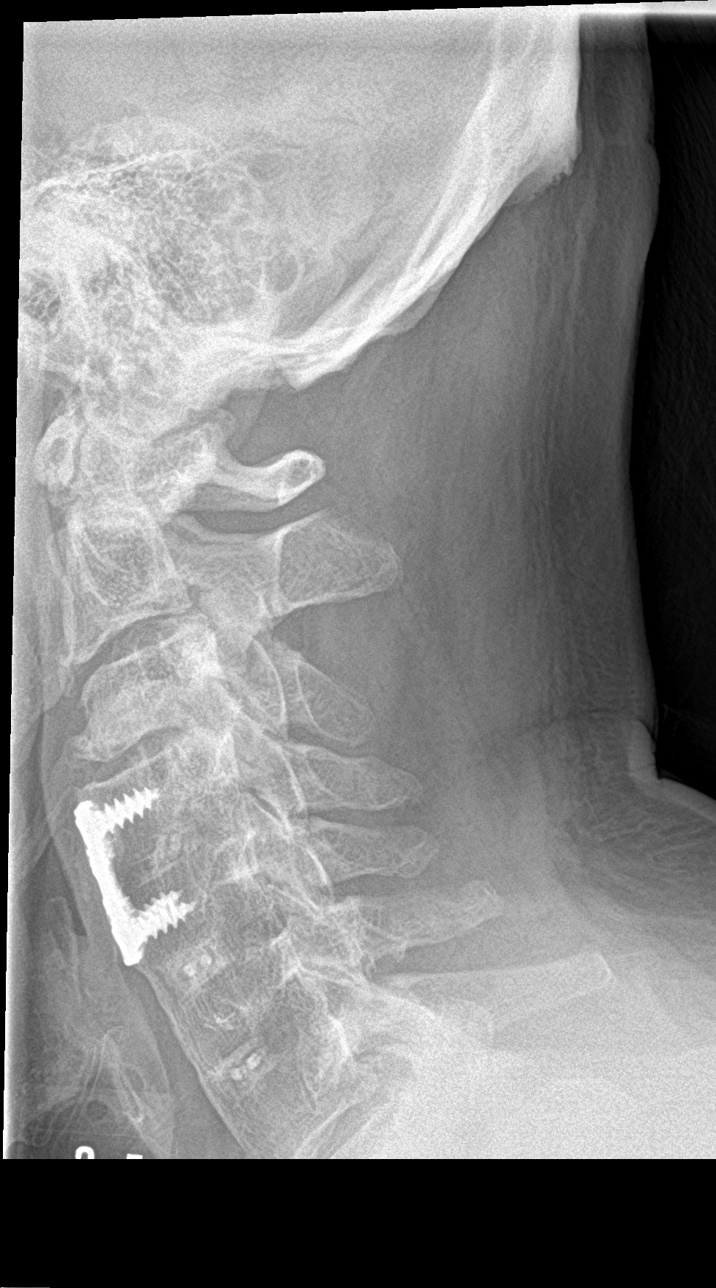

[c-spine obl (1 of 2)]
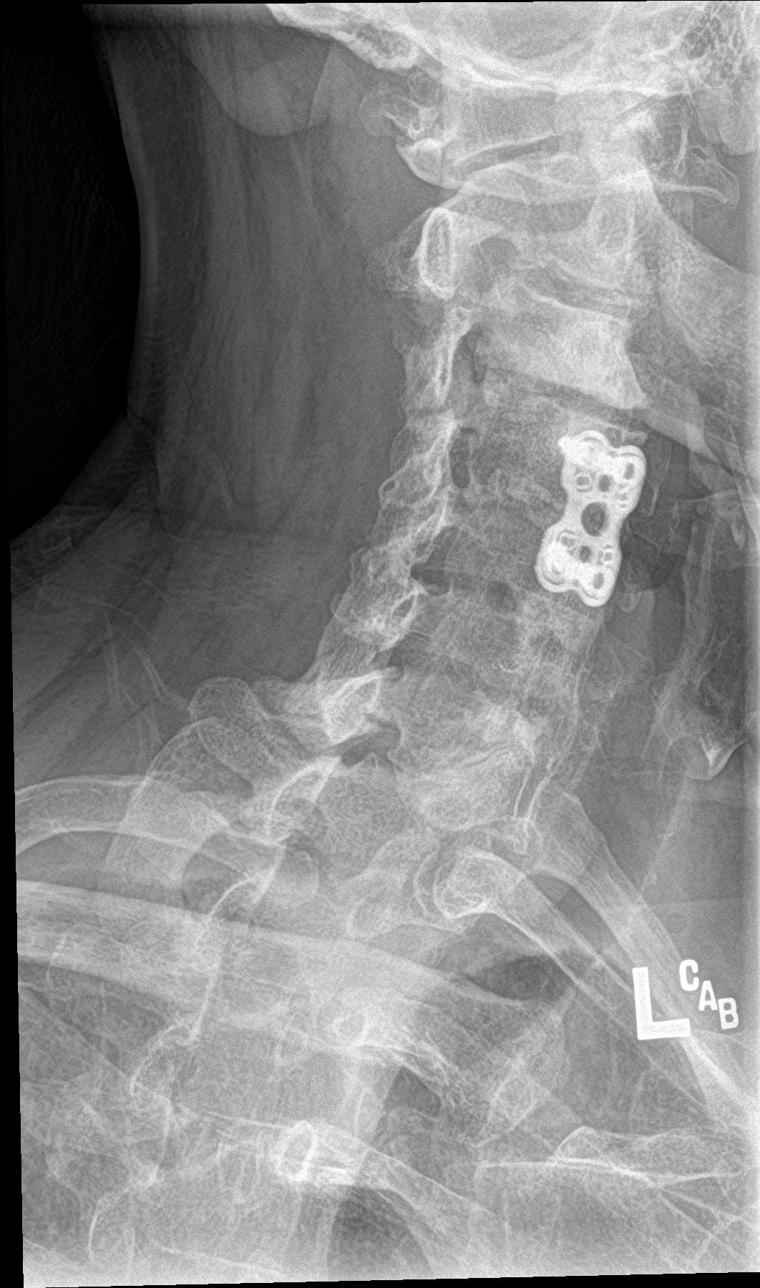

[c-spine obl (2 of 2)]
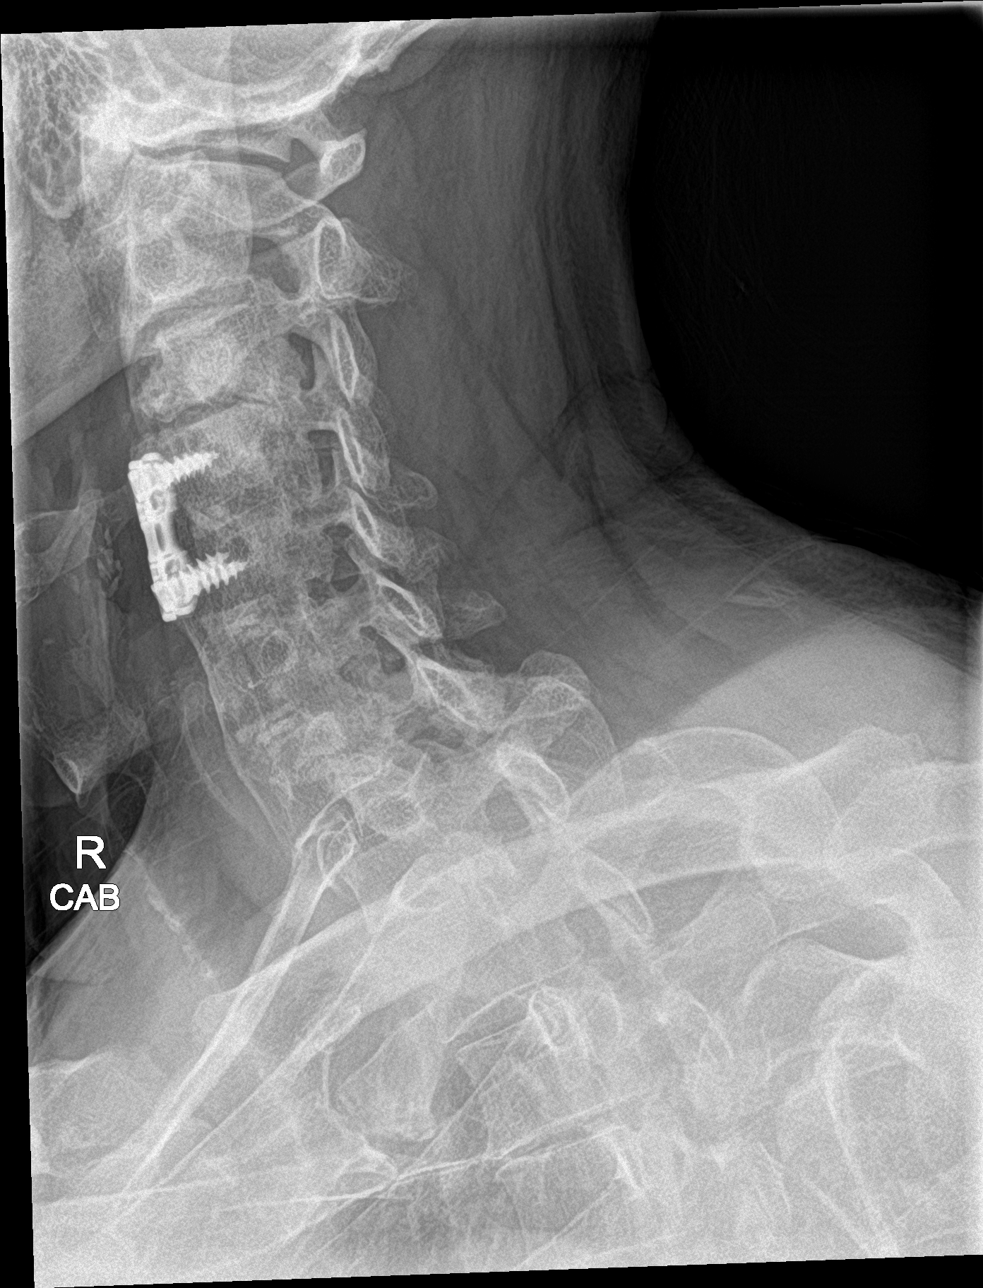

[c-spine ap]
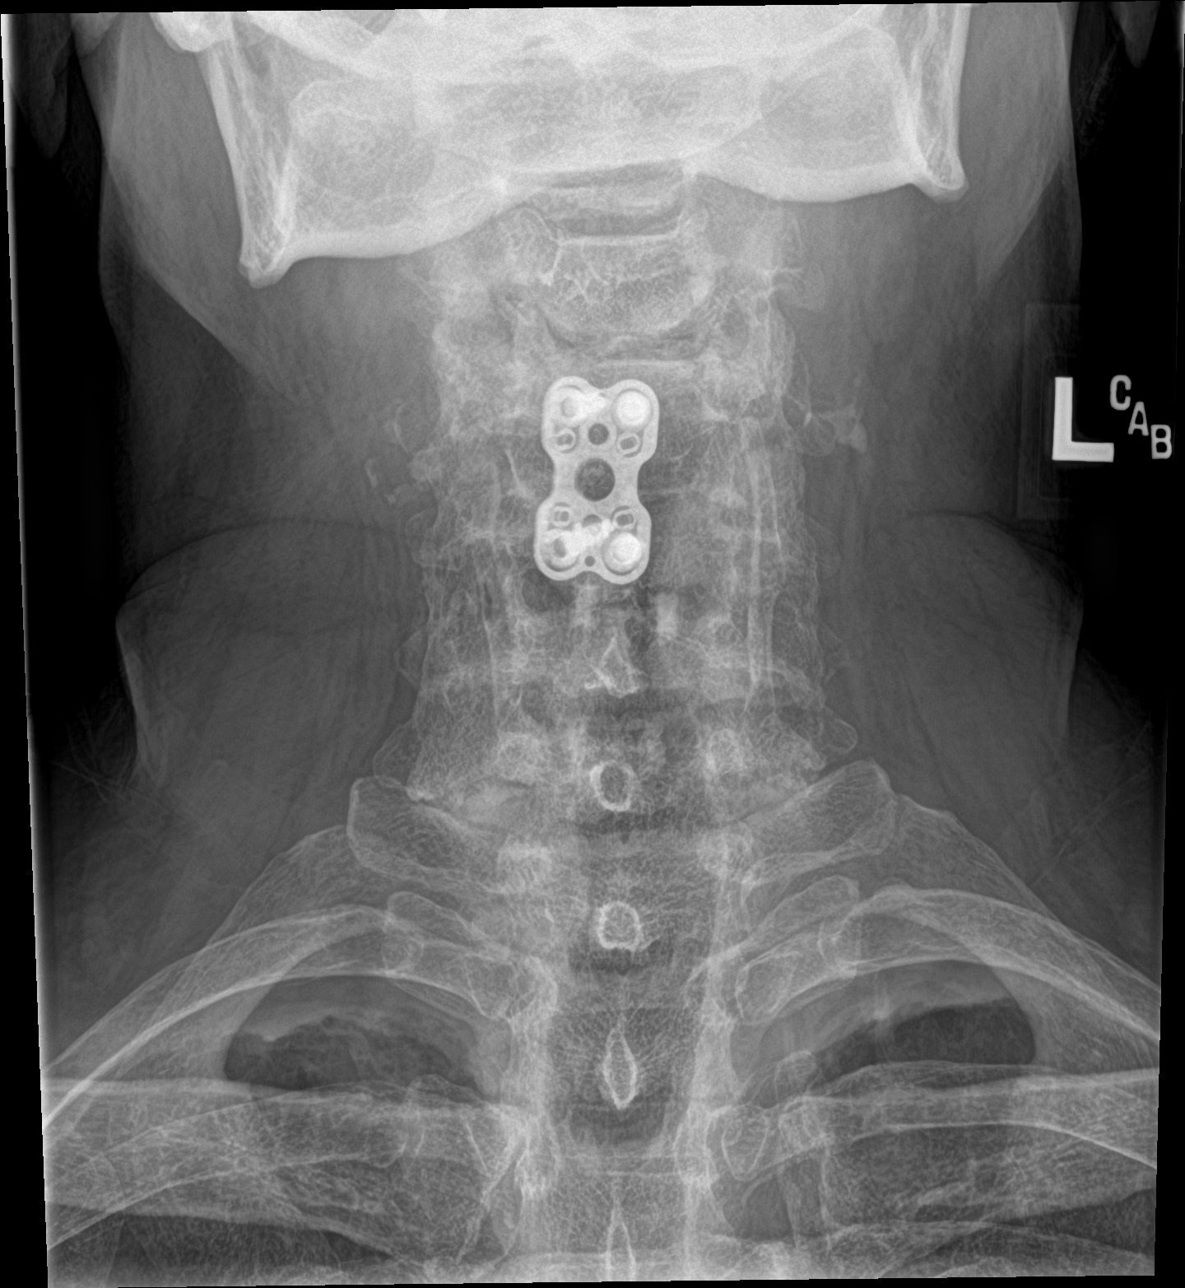

[c-spine open mouth]
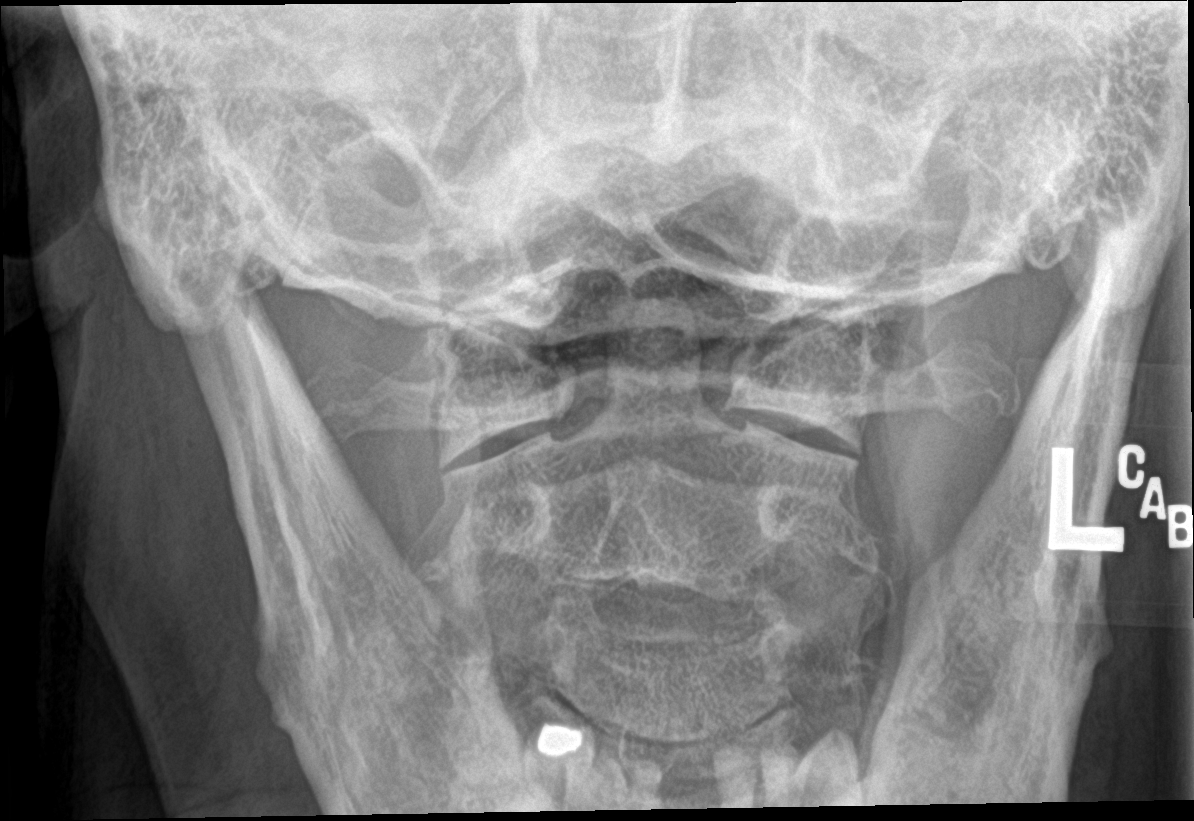

[5 of 5 positions shown; findings below may reference images not displayed]

FINDINGS: Plate and screw fixation device from anterior fusion noted at C4-5.
Fusion across the C5-6 and C6-7 disc spaces as well. Normal
alignment. Degenerative disc disease at C3-4 with disc space
narrowing and spurring. No fracture. Prevertebral soft tissues are
normal.
IMPRESSION: Fusion changes from C4-C7 with anterior plate at C4-5. No acute bony
abnormality.

## 2022-03-01 ENCOUNTER — Ambulatory Visit: Payer: Self-pay | Admitting: Internal Medicine
